# Patient Record
Sex: Female | Born: 1980
Health system: Southern US, Community
[De-identification: ages and names within clinical notes are randomized; demographics above are authoritative.]

## PROBLEM LIST (undated history)

## (undated) DIAGNOSIS — F329 Major depressive disorder, single episode, unspecified: Secondary | ICD-10-CM

## (undated) DIAGNOSIS — F32A Depression, unspecified: Secondary | ICD-10-CM

## (undated) DIAGNOSIS — R519 Headache, unspecified: Secondary | ICD-10-CM

## (undated) DIAGNOSIS — T7840XA Allergy, unspecified, initial encounter: Secondary | ICD-10-CM

## (undated) DIAGNOSIS — E785 Hyperlipidemia, unspecified: Secondary | ICD-10-CM

## (undated) DIAGNOSIS — R51 Headache: Secondary | ICD-10-CM

## (undated) DIAGNOSIS — F419 Anxiety disorder, unspecified: Secondary | ICD-10-CM

## (undated) HISTORY — DX: Headache: R51

## (undated) HISTORY — DX: Major depressive disorder, single episode, unspecified: F32.9

## (undated) HISTORY — PX: APPENDECTOMY: SHX54

## (undated) HISTORY — DX: Anxiety disorder, unspecified: F41.9

## (undated) HISTORY — DX: Hyperlipidemia, unspecified: E78.5

## (undated) HISTORY — DX: Depression, unspecified: F32.A

## (undated) HISTORY — DX: Allergy, unspecified, initial encounter: T78.40XA

## (undated) HISTORY — DX: Headache, unspecified: R51.9

---

## 2005-04-03 ENCOUNTER — Other Ambulatory Visit: Admission: RE | Admit: 2005-04-03 | Discharge: 2005-04-03 | Payer: Self-pay | Admitting: Family Medicine

## 2005-04-03 ENCOUNTER — Ambulatory Visit: Payer: Self-pay | Admitting: Family Medicine

## 2005-08-17 ENCOUNTER — Ambulatory Visit: Payer: Self-pay | Admitting: Family Medicine

## 2005-08-31 ENCOUNTER — Ambulatory Visit: Payer: Self-pay | Admitting: Internal Medicine

## 2005-09-07 ENCOUNTER — Ambulatory Visit: Payer: Self-pay | Admitting: Internal Medicine

## 2005-10-23 ENCOUNTER — Ambulatory Visit: Payer: Self-pay | Admitting: Internal Medicine

## 2006-02-04 ENCOUNTER — Ambulatory Visit: Payer: Self-pay | Admitting: Family Medicine

## 2006-02-05 ENCOUNTER — Ambulatory Visit: Payer: Self-pay | Admitting: Family Medicine

## 2006-05-03 ENCOUNTER — Encounter: Payer: Self-pay | Admitting: Family Medicine

## 2006-05-03 ENCOUNTER — Ambulatory Visit: Payer: Self-pay | Admitting: Family Medicine

## 2006-05-03 ENCOUNTER — Other Ambulatory Visit: Admission: RE | Admit: 2006-05-03 | Discharge: 2006-05-03 | Payer: Self-pay | Admitting: Family Medicine

## 2006-07-04 ENCOUNTER — Ambulatory Visit: Payer: Self-pay | Admitting: Family Medicine

## 2006-12-27 ENCOUNTER — Ambulatory Visit: Payer: Self-pay | Admitting: Family Medicine

## 2007-01-14 ENCOUNTER — Ambulatory Visit: Payer: Self-pay | Admitting: Internal Medicine

## 2007-05-07 DIAGNOSIS — F418 Other specified anxiety disorders: Secondary | ICD-10-CM | POA: Insufficient documentation

## 2007-05-14 ENCOUNTER — Other Ambulatory Visit: Admission: RE | Admit: 2007-05-14 | Discharge: 2007-05-14 | Payer: Self-pay | Admitting: Family Medicine

## 2007-05-14 ENCOUNTER — Ambulatory Visit: Payer: Self-pay | Admitting: Family Medicine

## 2007-05-14 ENCOUNTER — Encounter: Payer: Self-pay | Admitting: Family Medicine

## 2007-05-14 DIAGNOSIS — J309 Allergic rhinitis, unspecified: Secondary | ICD-10-CM | POA: Insufficient documentation

## 2007-05-14 LAB — CONVERTED CEMR LAB: Pap Smear: NORMAL

## 2007-05-27 ENCOUNTER — Telehealth: Payer: Self-pay | Admitting: Family Medicine

## 2007-06-05 ENCOUNTER — Telehealth (INDEPENDENT_AMBULATORY_CARE_PROVIDER_SITE_OTHER): Payer: Self-pay | Admitting: *Deleted

## 2007-06-05 DIAGNOSIS — F409 Phobic anxiety disorder, unspecified: Secondary | ICD-10-CM | POA: Insufficient documentation

## 2007-06-05 DIAGNOSIS — F5105 Insomnia due to other mental disorder: Secondary | ICD-10-CM

## 2007-06-10 ENCOUNTER — Encounter: Payer: Self-pay | Admitting: Family Medicine

## 2007-08-01 ENCOUNTER — Telehealth (INDEPENDENT_AMBULATORY_CARE_PROVIDER_SITE_OTHER): Payer: Self-pay | Admitting: *Deleted

## 2007-10-15 ENCOUNTER — Telehealth (INDEPENDENT_AMBULATORY_CARE_PROVIDER_SITE_OTHER): Payer: Self-pay | Admitting: *Deleted

## 2007-12-02 ENCOUNTER — Telehealth (INDEPENDENT_AMBULATORY_CARE_PROVIDER_SITE_OTHER): Payer: Self-pay | Admitting: *Deleted

## 2008-01-08 ENCOUNTER — Telehealth (INDEPENDENT_AMBULATORY_CARE_PROVIDER_SITE_OTHER): Payer: Self-pay | Admitting: *Deleted

## 2008-01-30 ENCOUNTER — Telehealth (INDEPENDENT_AMBULATORY_CARE_PROVIDER_SITE_OTHER): Payer: Self-pay | Admitting: *Deleted

## 2008-03-22 ENCOUNTER — Telehealth (INDEPENDENT_AMBULATORY_CARE_PROVIDER_SITE_OTHER): Payer: Self-pay | Admitting: *Deleted

## 2008-04-05 ENCOUNTER — Ambulatory Visit: Payer: Self-pay | Admitting: Family Medicine

## 2008-06-22 ENCOUNTER — Telehealth (INDEPENDENT_AMBULATORY_CARE_PROVIDER_SITE_OTHER): Payer: Self-pay | Admitting: *Deleted

## 2008-07-19 ENCOUNTER — Telehealth (INDEPENDENT_AMBULATORY_CARE_PROVIDER_SITE_OTHER): Payer: Self-pay | Admitting: *Deleted

## 2008-08-16 ENCOUNTER — Ambulatory Visit: Payer: Self-pay | Admitting: Family Medicine

## 2008-08-16 ENCOUNTER — Encounter: Payer: Self-pay | Admitting: Family Medicine

## 2008-08-16 ENCOUNTER — Other Ambulatory Visit: Admission: RE | Admit: 2008-08-16 | Discharge: 2008-08-16 | Payer: Self-pay | Admitting: Family Medicine

## 2008-08-16 DIAGNOSIS — F411 Generalized anxiety disorder: Secondary | ICD-10-CM | POA: Insufficient documentation

## 2008-08-18 ENCOUNTER — Telehealth (INDEPENDENT_AMBULATORY_CARE_PROVIDER_SITE_OTHER): Payer: Self-pay | Admitting: *Deleted

## 2008-08-20 ENCOUNTER — Encounter (INDEPENDENT_AMBULATORY_CARE_PROVIDER_SITE_OTHER): Payer: Self-pay | Admitting: *Deleted

## 2008-08-26 ENCOUNTER — Encounter (INDEPENDENT_AMBULATORY_CARE_PROVIDER_SITE_OTHER): Payer: Self-pay | Admitting: *Deleted

## 2008-08-26 LAB — CONVERTED CEMR LAB
AST: 19 units/L (ref 0–37)
Albumin: 3.7 g/dL (ref 3.5–5.2)
Alkaline Phosphatase: 43 units/L (ref 39–117)
Basophils Absolute: 0 10*3/uL (ref 0.0–0.1)
Chloride: 111 meq/L (ref 96–112)
Cholesterol: 164 mg/dL (ref 0–200)
Creatinine, Ser: 0.7 mg/dL (ref 0.4–1.2)
Eosinophils Absolute: 0.2 10*3/uL (ref 0.0–0.7)
GFR calc Af Amer: 129 mL/min
GFR calc non Af Amer: 107 mL/min
HCT: 40.6 % (ref 36.0–46.0)
HDL: 40 mg/dL (ref >39.0)
MCHC: 34.8 g/dL (ref 30.0–36.0)
MCV: 91.3 fL (ref 78.0–100.0)
Monocytes Absolute: 0.7 10*3/uL (ref 0.1–1.0)
Neutrophils Relative %: 59.9 % (ref 43.0–77.0)
Platelets: 346 10*3/uL (ref 150–400)
Potassium: 4 meq/L (ref 3.5–5.1)
RDW: 11.9 % (ref 11.5–14.6)
TSH: 2.08 microintl units/mL (ref 0.35–5.50)
Total Bilirubin: 1.3 mg/dL — ABNORMAL HIGH (ref 0.3–1.2)
Triglycerides: 103 mg/dL (ref 0–149)
VLDL: 21 mg/dL (ref 0–40)

## 2008-08-28 ENCOUNTER — Ambulatory Visit: Payer: Self-pay | Admitting: Family Medicine

## 2008-09-17 ENCOUNTER — Telehealth (INDEPENDENT_AMBULATORY_CARE_PROVIDER_SITE_OTHER): Payer: Self-pay | Admitting: *Deleted

## 2008-11-04 ENCOUNTER — Telehealth (INDEPENDENT_AMBULATORY_CARE_PROVIDER_SITE_OTHER): Payer: Self-pay | Admitting: *Deleted

## 2008-11-16 ENCOUNTER — Telehealth (INDEPENDENT_AMBULATORY_CARE_PROVIDER_SITE_OTHER): Payer: Self-pay | Admitting: *Deleted

## 2009-01-11 ENCOUNTER — Ambulatory Visit: Payer: Self-pay | Admitting: Family Medicine

## 2009-01-12 ENCOUNTER — Telehealth: Payer: Self-pay | Admitting: Family Medicine

## 2009-02-17 ENCOUNTER — Ambulatory Visit: Payer: Self-pay | Admitting: Family Medicine

## 2009-03-30 ENCOUNTER — Telehealth (INDEPENDENT_AMBULATORY_CARE_PROVIDER_SITE_OTHER): Payer: Self-pay | Admitting: *Deleted

## 2009-04-11 ENCOUNTER — Telehealth (INDEPENDENT_AMBULATORY_CARE_PROVIDER_SITE_OTHER): Payer: Self-pay | Admitting: *Deleted

## 2009-05-06 ENCOUNTER — Telehealth (INDEPENDENT_AMBULATORY_CARE_PROVIDER_SITE_OTHER): Payer: Self-pay | Admitting: *Deleted

## 2009-05-10 ENCOUNTER — Ambulatory Visit: Payer: Self-pay | Admitting: Family Medicine

## 2009-05-10 LAB — CONVERTED CEMR LAB
Beta hcg, urine, semiquantitative: NEGATIVE
Bilirubin Urine: NEGATIVE
Blood in Urine, dipstick: NEGATIVE
Ketones, urine, test strip: NEGATIVE
Nitrite: NEGATIVE
Specific Gravity, Urine: 1.015

## 2009-05-11 ENCOUNTER — Telehealth (INDEPENDENT_AMBULATORY_CARE_PROVIDER_SITE_OTHER): Payer: Self-pay | Admitting: *Deleted

## 2009-05-17 ENCOUNTER — Telehealth: Payer: Self-pay | Admitting: Family Medicine

## 2009-05-25 ENCOUNTER — Telehealth (INDEPENDENT_AMBULATORY_CARE_PROVIDER_SITE_OTHER): Payer: Self-pay | Admitting: *Deleted

## 2009-07-04 ENCOUNTER — Telehealth (INDEPENDENT_AMBULATORY_CARE_PROVIDER_SITE_OTHER): Payer: Self-pay | Admitting: *Deleted

## 2009-08-17 ENCOUNTER — Encounter: Payer: Self-pay | Admitting: Internal Medicine

## 2009-08-17 ENCOUNTER — Other Ambulatory Visit: Admission: RE | Admit: 2009-08-17 | Discharge: 2009-08-17 | Payer: Self-pay | Admitting: Internal Medicine

## 2009-08-17 ENCOUNTER — Ambulatory Visit: Payer: Self-pay | Admitting: Internal Medicine

## 2009-08-17 DIAGNOSIS — R5383 Other fatigue: Secondary | ICD-10-CM

## 2009-08-17 DIAGNOSIS — R5381 Other malaise: Secondary | ICD-10-CM

## 2009-08-17 DIAGNOSIS — E785 Hyperlipidemia, unspecified: Secondary | ICD-10-CM

## 2009-08-17 LAB — CONVERTED CEMR LAB
ALT: 13 units/L (ref 0–35)
AST: 17 units/L (ref 0–37)
Basophils Relative: 0 % (ref 0.0–3.0)
Bilirubin Urine: NEGATIVE
Chloride: 106 meq/L (ref 96–112)
Cholesterol: 205 mg/dL — ABNORMAL HIGH (ref 0–200)
Direct LDL: 138 mg/dL
Eosinophils Relative: 0.9 % (ref 0.0–5.0)
GFR calc non Af Amer: 90.42 mL/min (ref >60)
HCT: 41.7 % (ref 36.0–46.0)
Hemoglobin, Urine: NEGATIVE
Hemoglobin: 14.5 g/dL (ref 12.0–15.0)
Iron: 165 ug/dL — ABNORMAL HIGH (ref 42–145)
Ketones, ur: NEGATIVE mg/dL
Leukocytes, UA: NEGATIVE
Lymphs Abs: 2 10*3/uL (ref 0.7–4.0)
MCV: 91.4 fL (ref 78.0–100.0)
Monocytes Absolute: 0.5 10*3/uL (ref 0.1–1.0)
Monocytes Relative: 6.3 % (ref 3.0–12.0)
Neutro Abs: 5.5 10*3/uL (ref 1.4–7.7)
Platelets: 299 10*3/uL (ref 150.0–400.0)
Potassium: 4.3 meq/L (ref 3.5–5.1)
RBC: 4.57 M/uL (ref 3.87–5.11)
Saturation Ratios: 36.7 % (ref 20.0–50.0)
Sodium: 138 meq/L (ref 135–145)
TSH: 0.94 microintl units/mL (ref 0.35–5.50)
Total Bilirubin: 1.3 mg/dL — ABNORMAL HIGH (ref 0.3–1.2)
Total CHOL/HDL Ratio: 4
Total Protein: 7.2 g/dL (ref 6.0–8.3)
Transferrin: 321.1 mg/dL (ref 212.0–360.0)
Triglycerides: 117 mg/dL (ref 0.0–149.0)
Vit D, 25-Hydroxy: 28 ng/mL — ABNORMAL LOW (ref 30–89)
WBC: 8.1 10*3/uL (ref 4.5–10.5)
pH: 5.5 (ref 5.0–8.0)

## 2009-08-18 ENCOUNTER — Encounter: Payer: Self-pay | Admitting: Internal Medicine

## 2009-08-19 ENCOUNTER — Encounter: Payer: Self-pay | Admitting: Internal Medicine

## 2009-08-19 ENCOUNTER — Telehealth: Payer: Self-pay | Admitting: Internal Medicine

## 2009-09-13 ENCOUNTER — Telehealth: Payer: Self-pay | Admitting: Internal Medicine

## 2009-09-14 ENCOUNTER — Telehealth: Payer: Self-pay | Admitting: Internal Medicine

## 2009-10-07 ENCOUNTER — Telehealth: Payer: Self-pay | Admitting: Internal Medicine

## 2009-10-11 ENCOUNTER — Telehealth: Payer: Self-pay | Admitting: Internal Medicine

## 2009-10-21 ENCOUNTER — Telehealth: Payer: Self-pay | Admitting: Internal Medicine

## 2009-10-24 ENCOUNTER — Telehealth: Payer: Self-pay | Admitting: Internal Medicine

## 2009-11-01 ENCOUNTER — Telehealth (INDEPENDENT_AMBULATORY_CARE_PROVIDER_SITE_OTHER): Payer: Self-pay | Admitting: *Deleted

## 2009-11-02 ENCOUNTER — Ambulatory Visit: Payer: Self-pay | Admitting: Internal Medicine

## 2009-11-02 DIAGNOSIS — M26609 Unspecified temporomandibular joint disorder, unspecified side: Secondary | ICD-10-CM | POA: Insufficient documentation

## 2010-01-09 ENCOUNTER — Telehealth: Payer: Self-pay | Admitting: Internal Medicine

## 2010-01-13 ENCOUNTER — Telehealth: Payer: Self-pay | Admitting: Internal Medicine

## 2010-01-18 ENCOUNTER — Ambulatory Visit: Payer: Self-pay | Admitting: Internal Medicine

## 2010-03-16 ENCOUNTER — Telehealth: Payer: Self-pay | Admitting: Internal Medicine

## 2010-03-21 ENCOUNTER — Telehealth: Payer: Self-pay | Admitting: Internal Medicine

## 2010-04-17 ENCOUNTER — Encounter: Payer: Self-pay | Admitting: Internal Medicine

## 2010-07-06 ENCOUNTER — Telehealth: Payer: Self-pay | Admitting: Internal Medicine

## 2010-07-13 ENCOUNTER — Telehealth: Payer: Self-pay | Admitting: Internal Medicine

## 2010-08-18 ENCOUNTER — Other Ambulatory Visit: Admission: RE | Admit: 2010-08-18 | Discharge: 2010-08-18 | Payer: Self-pay | Admitting: Internal Medicine

## 2010-08-18 ENCOUNTER — Ambulatory Visit: Payer: Self-pay | Admitting: Internal Medicine

## 2010-08-18 LAB — CONVERTED CEMR LAB
Basophils Absolute: 0 10*3/uL (ref 0.0–0.1)
Eosinophils Absolute: 0.1 10*3/uL (ref 0.0–0.7)
HCT: 39.7 % (ref 36.0–46.0)
HDL: 61.1 mg/dL (ref >39.00)
Lymphs Abs: 2.2 10*3/uL (ref 0.7–4.0)
MCV: 89.8 fL (ref 78.0–100.0)
Monocytes Absolute: 0.7 10*3/uL (ref 0.1–1.0)
Platelets: 290 10*3/uL (ref 150.0–400.0)
RDW: 12.6 % (ref 11.5–14.6)
TSH: 1.56 microintl units/mL (ref 0.35–5.50)
Triglycerides: 109 mg/dL (ref 0.0–149.0)
VLDL: 21.8 mg/dL (ref 0.0–40.0)

## 2010-08-25 ENCOUNTER — Encounter: Payer: Self-pay | Admitting: Internal Medicine

## 2010-09-07 ENCOUNTER — Encounter: Payer: Self-pay | Admitting: Internal Medicine

## 2010-09-11 ENCOUNTER — Telehealth: Payer: Self-pay | Admitting: Internal Medicine

## 2010-10-09 ENCOUNTER — Telehealth: Payer: Self-pay | Admitting: Internal Medicine

## 2010-10-30 ENCOUNTER — Telehealth (INDEPENDENT_AMBULATORY_CARE_PROVIDER_SITE_OTHER): Payer: Self-pay | Admitting: *Deleted

## 2010-12-05 ENCOUNTER — Telehealth: Payer: Self-pay | Admitting: Internal Medicine

## 2011-01-16 NOTE — Progress Notes (Signed)
Summary: REFERRAL  Phone Note Call from Patient Call back at Home Phone [phone]   Summary of Call: Pt found an "in network" oral surgeon and we referred her to someone out of network. She is req a call to give [place] the name.  Initial call taken by: Lamar Sprinkles, CMA,  January 13, 2010 4:06 PM  Follow-up for Phone Call        Please put in new referral to Dr Alonna Buckler at [facility] [employer] [employer]. She has phone # and will call office back monday with that if needed.  Follow-up by: Lamar Sprinkles, CMA,  January 13, 2010 6:18 PM

## 2011-01-16 NOTE — Miscellaneous (Signed)
Summary: Advanced Directive and Durable Power of Attorney for Health Care  Advanced Directive and Durable Power of Attorney for Health Care   Imported By: Lester Kenwood 09/22/2010 08:18:03  _____________________________________________________________________  External Attachment:    Type:   Image     Comment:   External Document

## 2011-01-16 NOTE — Progress Notes (Signed)
Summary: ALT BCP  Phone Note Call from Patient Call back at [facility] Phone [phone]   Summary of Call: Patient is requesting to change from Jolessa to Yaz to help with acne. If ok, how does she change from one to the other? She just started her first pill, yesterday, of a new pack of Jolessa.  Initial call taken by: Lamar Sprinkles, CMA,  September 11, 2010 3:44 PM  Follow-up for Phone Call        change to yaz on the next pak Follow-up by: Etta Grandchild MD,  September 11, 2010 3:53 PM  Additional Follow-up for Phone Call Additional follow up Details #1::        Left detailed vm on pt's home # Additional Follow-up by: Lamar Sprinkles, CMA,  September 11, 2010 5:04 PM    New/Updated Medications: YAZ 3-0.02 MG TABS (DROSPIRENONE-ETHINYL ESTRADIOL) as directed Prescriptions: YAZ 3-0.02 MG TABS (DROSPIRENONE-ETHINYL ESTRADIOL) as directed  #3 x 1   Entered by:   Lamar Sprinkles, CMA   Authorized by:   Etta Grandchild MD   Signed by:   Lamar Sprinkles, CMA on 09/11/2010   Method used:   Electronically to        [facility] (retail)       [address]       [place], [place]  [address]       Ph: [phone]       Fax: [phone]   RxID:   [id number]

## 2011-01-16 NOTE — Letter (Signed)
Summary: Results Follow-up Letter  [facility]  [address] [place], [place] [address]   Phone: [phone]  Fax: [phone]    08/25/2010  [address] [place], [place]  [address]  Dear Ms. [facility],   The following are the results of your recent test(s):  Test     Result     Pap Smear    Normal___XX____  Not Normal_____       Comments: _________________________________________________________ Cholesterol LDL(Bad cholesterol):          Your goal is less than:         HDL (Good cholesterol):        Your goal is more than: _________________________________________________________ Other Tests:   _________________________________________________________  Please call for an appointment Or _________________________________________________________ _________________________________________________________ _________________________________________________________  Sincerely,  Sanda Linger MD [facility]           Appended Document: Results Follow-up Letter Mailed.

## 2011-01-16 NOTE — Progress Notes (Signed)
  Phone Note Refill Request Message from:  Fax from Pharmacy on March 16, 2010 10:46 AM  Refills Requested: Medication #1:  ALPRAZOLAM 0.25 MG TABS 1 by mouth three times a day   Supply Requested: 1 month   Last Refilled: 08/17/2009  Is this OK to refill for pt. Please advise thanks  [facility]  Initial call taken by: Rock Nephew CMA,  March 16, 2010 11:19 AM  Follow-up for Phone Call        yes Follow-up by: Etta Grandchild MD,  March 16, 2010 10:52 AM     Appended Document:      Clinical Lists Changes  Medications: Rx of ALPRAZOLAM 0.25 MG TABS (ALPRAZOLAM) 1 by mouth three times a day;  #90 x 4;  Signed;  Entered by: Rock Nephew CMA;  Authorized by: Etta Grandchild MD;  Method used: Telephoned to [facility] [address] [place], [place], [place]  [address], Ph: [phone], Fax: [phone]    Prescriptions: ALPRAZOLAM 0.25 MG TABS (ALPRAZOLAM) 1 by mouth three times a day  #90 x 4   Entered by:   Rock Nephew CMA   Authorized by:   Etta Grandchild MD   Signed by:   Rock Nephew CMA on 03/20/2010   Method used:   Telephoned to ...       [facility] (retail)       [address]       [place], [place]  [address]       Ph: [phone]       Fax: [phone]   RxID:   [id number]

## 2011-01-16 NOTE — Progress Notes (Signed)
Summary: REFILL   Phone Note Refill Request Call back at Home Phone [phone] Message from:  Patient  Refills Requested: Medication #1:  KLONOPIN 1 MG TABS Take 1 tablet by mouth three times a day Initial call taken by: Lamar Sprinkles, CMA,  March 21, 2010 4:54 PM  Follow-up for Phone Call        yes Follow-up by: Etta Grandchild MD,  March 22, 2010 7:57 AM  Additional Follow-up for Phone Call Additional follow up Details #1::        patient aware. Additional Follow-up by: Lucious Groves,  March 23, 2010 4:53 PM    Prescriptions: KLONOPIN 1 MG TABS (CLONAZEPAM) Take 1 tablet by mouth three times a day  #90 x 3   Entered by:   Lucious Groves   Authorized by:   Etta Grandchild MD   Signed by:   Lucious Groves on 03/23/2010   Method used:   Telephoned to ...       [facility] (retail)       [address]       [place], [place]  [address]       Ph: [phone]       Fax: [phone]   RxID:   [id number]

## 2011-01-16 NOTE — Progress Notes (Signed)
Summary: clonazepam refill  Phone Note Refill Request Message from:  Fax from Pharmacy on July 13, 2010 2:53 PM  Refills Requested: Medication #1:  KLONOPIN 1 MG TABS Take 1 tablet by mouth three times a day   Dosage confirmed as above?Dosage Confirmed   Supply Requested: 1 month   Last Refilled: 03/23/2010   [facility]?   Method Requested: Telephone to Pharmacy Initial call taken by: Rock Nephew CMA,  July 13, 2010 2:53 PM  Follow-up for Phone Call        ok, pls ask her to f/up soon Follow-up by: Etta Grandchild MD,  July 13, 2010 3:29 PM    Prescriptions: Erin Mays 1 MG TABS (CLONAZEPAM) Take 1 tablet by mouth three times a day  #90 x 3   Entered by:   Lamar Sprinkles, CMA   Authorized by:   Etta Grandchild MD   Signed by:   Lamar Sprinkles, CMA on 07/13/2010   Method used:   Telephoned to ...       [facility] (retail)       [address]       [place], [place]  [address]       Ph: [phone]       Fax: [phone]   RxID:   [id number]

## 2011-01-16 NOTE — Letter (Signed)
Summary: Lipid Letter  [facility]  [address] [place], [place] [address]   Phone: [phone]  Fax: [phone]    08/18/2010  [facility] [address] [place], [place]  [address]  Dear Erin Mays:  We have carefully reviewed your last lipid profile from 08/18/2010 and the results are noted below with a summary of recommendations for lipid management.    Cholesterol:       194     Goal: <200   HDL "good" Cholesterol:   57.84     Goal: >40   LDL "bad" Cholesterol:   111     Goal: <130   Triglycerides:       109.0     Goal: <150        TLC Diet (Therapeutic Lifestyle Change): Saturated Fats & Transfatty acids should be kept < 7% of total calories ***Reduce Saturated Fats Polyunstaurated Fat can be up to 10% of total calories Monounsaturated Fat Fat can be up to 20% of total calories Total Fat should be no greater than 25-35% of total calories Carbohydrates should be 50-60% of total calories Protein should be approximately 15% of total calories Fiber should be at least 20-30 grams a day ***Increased fiber may help lower LDL Total Cholesterol should be < 200mg /day Consider adding plant stanol/sterols to diet (example: Benacol spread) ***A higher intake of unsaturated fat may reduce Triglycerides and Increase HDL    Adjunctive Measures (may lower LIPIDS and reduce risk of Heart Attack) include: Aerobic Exercise (20-30 minutes 3-4 times a week) Limit Alcohol Consumption Weight Reduction Aspirin 75-81 mg a day by mouth (if not allergic or contraindicated) Dietary Fiber 20-30 grams a day by mouth     Current Medications: 1)    Jolessa 0.15-0.03 Mg Tabs (Levonorgest-eth estrad 91-day) .... As directed 2)    Multivitamin  .... One by mouth once daily 3)    Klonopin 1 Mg Tabs (Clonazepam) .... Take 1 tablet by mouth three times a day 4)    Bupropion Hcl 75 Mg Tabs (Bupropion hcl) .... One by mouth bid 5)    Nasonex 50 Mcg/act Susp (Mometasone furoate)  .... 2 puffs each nostril once daily  If you have any questions, please call. We appreciate being able to work with you.   Sincerely,    [facility] Etta Grandchild MD

## 2011-01-16 NOTE — Consult Note (Signed)
Summary: [facility]   Imported By: Sherian Rein 04/27/2010 08:05:32  _____________________________________________________________________  External Attachment:    Type:   Image     Comment:   External Document

## 2011-01-16 NOTE — Assessment & Plan Note (Signed)
Summary: CPX /  LABS AFTER /NWS   #   Vital Signs:  Patient profile:   30 year old female Menstrual status:  regular LMP:     08/13/2010 Height:      66 inches (167.64 cm) Weight:      139 pounds (63.18 kg) BMI:     22.52 O2 Sat:      98 % on Room air Temp:     98.6 degrees F (37.00 degrees C) oral Pulse rate:   84 / minute Pulse rhythm:   regular Resp:     16 per minute BP sitting:   104 / 66  (left arm) Cuff size:   regular  Vitals Entered By: Lucious Groves CMA (August 18, 2010 8:56 AM)  O2 Flow:  Room air CC: CPX/Discuss Meds./kb, Preventive Care Is Patient Diabetic? No Pain Assessment Patient in pain? no      LMP (date): 08/13/2010     Enter LMP: 08/13/2010 Last PAP Result NEGATIVE FOR INTRAEPITHELIAL LESIONS OR MALIGNANCY.   Primary Care Provider:  Yetta Barre  CC:  CPX/Discuss Meds./kb and Preventive Care.  History of Present Illness: She returns for a complete physical but also complains of seasonal allergies and wants to try Nasonex NS again.  Preventive Screening-Counseling & Management  Alcohol-Tobacco     Alcohol drinks/day: 1     Alcohol type: wine     Smoking Status: never     Passive Smoke Exposure: no     Tobacco Counseling: not indicated; no tobacco use  Hep-HIV-STD-Contraception     Hepatitis Risk: no risk noted     HIV Risk: no     STD Risk: no risk noted     Dental Visit-last 6 months yes     Dental Care Counseling: to seek dental care; no dental care within six months     SBE monthly: yes     SBE Education/Counseling: to perform regular SBE     Sun Exposure-Excessive: occasionally     Sun Exposure Counseling: to decrease sun exposure  Safety-Violence-Falls     Seat Belt Use: yes     Helmet Use: yes     Firearms in the Home: no firearms in the home     Smoke Detectors: yes     Violence in the Home: no risk noted     Sexual Abuse: no      Sexual History:  currently monogamous.        Drug Use:  never.        Blood Transfusions:   no.    Clinical Review Panels:  Immunizations   Last Tetanus Booster:  Tdap (08/16/2008)   Medications Prior to Update: 1)  Celexa 20 Mg Tabs (Citalopram Hydrobromide) .... Take 1 Tablet By Mouth Once A Day 2)  Jolessa 0.15-0.03 Mg Tabs (Levonorgest-Eth Estrad 91-Day) .... As Directed 3)  Prenatal Multivitamin .... One By Mouth Once Daily / [facility] Brand Ok 4)  Alprazolam 0.25 Mg Tabs (Alprazolam) .Marland Kitchen.. 1 By Mouth Three Times A Day 5)  Klonopin 1 Mg Tabs (Clonazepam) .... Take 1 Tablet By Mouth Three Times A Day 6)  Valtrex 1 Gm Tabs (Valacyclovir Hcl) .... Take 1 By Mouth Once Daily 7)  Bupropion Hcl 75 Mg Tabs (Bupropion Hcl) .... One By Mouth Bid 8)  Amrix 15 Mg Xr24h-Cap (Cyclobenzaprine Hcl) .... Once Daily At Bedtime  Current Medications (verified): 1)  Jolessa 0.15-0.03 Mg Tabs (Levonorgest-Eth Estrad 91-Day) .... As Directed 2)  Multivitamin .... One By Mouth  Once Daily 3)  Klonopin 1 Mg Tabs (Clonazepam) .... Take 1 Tablet By Mouth Three Times A Day 4)  Bupropion Hcl 75 Mg Tabs (Bupropion Hcl) .... One By Mouth Bid 5)  Nasonex 50 Mcg/act Susp (Mometasone Furoate) .... 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 Family History Depression Family History High cholesterol Family History Hypertension MGF- diabetes MGaunt- Breast CA  Social History: Reviewed history from 08/17/2009 and no changes required. 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 ENT:  Complains of nasal congestion and postnasal drainage; denies decreased hearing, difficulty swallowing, ear discharge, earache, hoarseness, nosebleeds, ringing in ears, sinus pressure, and sore throat. GU:  Denies abnormal vaginal bleeding, discharge, dysuria, genital sores, hematuria, and nocturia. Psych:  Complains of anxiety; denies alternate hallucination ( auditory/visual), depression, easily angered, easily tearful, irritability, mental problems, panic attacks, sense of great danger, suicidal thoughts/plans, thoughts of violence, unusual visions or sounds, and thoughts /plans of harming others.  Physical Exam  General:  alert, well-developed, well-nourished, well-hydrated, appropriate dress, normal appearance, healthy-appearing, cooperative to examination, and good hygiene.   Head:  normocephalic, atraumatic, no abnormalities observed, no abnormalities palpated, and no alopecia.   Eyes:  vision grossly intact, pupils equal, pupils round, and pupils reactive to light.   Ears:  R ear normal and L ear normal.   Nose:  no external deformity, no external erythema, no nasal discharge, no airflow obstruction, no intranasal foreign body, no nasal polyps, no nasal  mucosal lesions, no mucosal friability, no active bleeding or clots, no sinus percussion tenderness, no septum abnormalities, nasal dischargemucosal pallor, and mucosal edema.   Mouth:  Oral mucosa and oropharynx without lesions or exudates.  Teeth in good repair. Neck:  supple, full ROM, no masses, no thyromegaly, no thyroid nodules or tenderness, normal carotid upstroke, no carotid bruits, no cervical lymphadenopathy, and no neck tenderness.   Chest Wall:  no deformities, no tenderness, and no mass.   Breasts:  left nipple pierced. skin/areolae normal, no masses, no abnormal thickening, no nipple discharge, and no tenderness.   Lungs:  normal respiratory effort, no intercostal retractions, no accessory muscle use, normal breath sounds, no dullness, no fremitus, no crackles, and no wheezes.   Heart:  normal rate, regular rhythm, no murmur, no gallop, no rub, and no JVD.   Abdomen:  soft, non-tender, normal bowel sounds, no distention, no masses, no guarding, no rigidity, no rebound tenderness, no abdominal hernia, no inguinal hernia, no  hepatomegaly, and no splenomegaly.   Genitalia:  Normal introitus for age, no external lesions, no vaginal discharge, mucosa pink and moist, no vaginal or cervical lesions, no vaginal atrophy, no friaility or hemorrhage, normal uterus size and position, no adnexal masses or tenderness Msk:  normal ROM, no joint tenderness, no joint swelling, no joint warmth, no redness over joints, no joint deformities, no joint instability, and no crepitation.   Pulses:  R and L carotid,radial,femoral,dorsalis pedis and posterior tibial pulses are full and equal bilaterally Extremities:  No clubbing, cyanosis, edema, or deformity noted with normal full range of motion of all joints.   Neurologic:  No cranial nerve deficits noted. Station and gait are normal. Plantar reflexes are down-going bilaterally. DTRs are symmetrical throughout. Sensory, motor and coordinative functions appear  intact. Skin:  turgor normal, no rashes, no suspicious lesions, no ecchymoses, no petechiae, no purpura, no ulcerations, no edema, body piercing(s), and excessive tan.   Cervical Nodes:  no anterior cervical adenopathy and no posterior cervical adenopathy.   Axillary Nodes:  no R axillary adenopathy and no L axillary adenopathy.   Inguinal Nodes:  no R inguinal adenopathy and no L inguinal adenopathy.   Psych:  Oriented X3, memory intact for recent and remote, normally interactive, good eye contact, not anxious appearing, not depressed appearing, not agitated, not suicidal, and not homicidal.     Impression & Recommendations:  Problem # 1:  ROUTINE GENERAL MEDICAL EXAM@HEALTH  CARE FACL (ICD-V70.0) Assessment Unchanged  Orders: T-GC Probe, genital [phone]) T-Chlamydia Probe, genital [phone])  Pap smear: NEGATIVE FOR INTRAEPITHELIAL LESIONS OR MALIGNANCY. (08/17/2009) Td Booster: Tdap (08/16/2008)   Chol: 205 (08/17/2009)   HDL: 49.50 (08/17/2009)   LDL: 103 (08/16/2008)   TG: 117.0 (08/17/2009) TSH: 0.94 (08/17/2009)    Discussed using sunscreen, use of alcohol, drug use, self breast exam, routine dental care, routine eye care, schedule for GYN exam, routine physical exam, seat belts, multiple vitamins, osteoporosis prevention, adequate calcium intake in diet, recommendations for immunizations, mammograms and Pap smears.  Discussed exercise and checking cholesterol.  Discussed gun safety, safe sex, and contraception.  Problem # 2:  FATIGUE, ACUTE (ICD-780.79) Assessment: Unchanged  Orders: Venipuncture (30865) TLB-Lipid Panel (80061-LIPID) TLB-CBC Platelet - w/Differential (85025-CBCD) TLB-TSH (Thyroid Stimulating Hormone) (84443-TSH)  Problem # 3:  HYPERLIPIDEMIA (ICD-272.4) Assessment: Unchanged  Orders: Venipuncture (78469) TLB-Lipid Panel (80061-LIPID) TLB-CBC Platelet - w/Differential (85025-CBCD) TLB-TSH (Thyroid Stimulating Hormone) (84443-TSH)  Labs  Reviewed: SGOT: 17 (08/17/2009)   SGPT: 13 (08/17/2009)   HDL:49.50 (08/17/2009), 40.0 (08/16/2008)  LDL:103 (08/16/2008)  Chol:205 (08/17/2009), 164 (08/16/2008)  Trig:117.0 (08/17/2009), 103 (08/16/2008)  Problem # 4:  ALLERGIC RHINITIS (ICD-477.9) Assessment: Deteriorated  Her updated medication list for this problem includes:    Nasonex 50 Mcg/act Susp (Mometasone furoate) .Marland Kitchen... 2 puffs each nostril once daily  Discussed use of allergy medications and environmental measures.   Complete Medication List: 1)  Jolessa 0.15-0.03 Mg Tabs (Levonorgest-eth estrad 91-day) .... As directed 2)  Multivitamin  .... One by mouth once daily 3)  Klonopin 1 Mg Tabs (Clonazepam) .... Take 1 tablet by mouth three times a day 4)  Bupropion Hcl 75 Mg Tabs (Bupropion hcl) .... One by mouth bid 5)  Nasonex 50 Mcg/act Susp (Mometasone furoate) .... 2 puffs each nostril once daily  PAP Screening:    Hx Cervical Dysplasia in last 5 yrs? No    3 normal PAP smears in last 5 yrs? Yes    Last PAP smear:  08/17/2009    Reviewed PAP  smear recommendations:  PAP smear done  Osteoporosis Risk Assessment:  Risk Factors for Fracture or Low Bone Density:   Race (White or Asian):     yes   Smoking status:       never  Immunization & Chemoprophylaxis:    Tetanus vaccine: Tdap  (08/16/2008)  Patient Instructions: 1)  Please schedule a follow-up appointment as needed. 2)  If you could be exposed to sexually transmitted diseases, you should use a condom. 3)  If you are having sex and you or your partner don't want a child, use contraception. Prescriptions: NASONEX 50 MCG/ACT SUSP (MOMETASONE FUROATE) 2 puffs each nostril once daily  #5 inhs x 0   Entered and Authorized by:   Etta Grandchild MD   Signed by:   Etta Grandchild MD on 08/18/2010   Method used:   Samples Given   RxID:   [id number]

## 2011-01-16 NOTE — Progress Notes (Signed)
----   Converted from flag ---- ---- 10/09/2010 4:07 PM, Rock Nephew CMA wrote: VALACYCLOVIR 1GM    TAB GREE   NDCNUM:          [id number]   Instructions:    TAKE ONE TABLET BY MOUTH ONE TIME DAILY   Quantity:        30 Tablet   Refills:         3      Date of Request: 10/08/2010 10:44:41    Is this ok to refill? ------------------------------     Follow-up for Phone Call       Follow-up by: Etta Grandchild MD,  October 09, 2010 4:11 PM    Additional Follow-up for Phone Call Additional follow up Details #2::    yes Follow-up by: Etta Grandchild MD,  October 09, 2010 4:11 PM

## 2011-01-16 NOTE — Progress Notes (Signed)
Summary: xanax refill  Phone Note Refill Request Message from:  Fax from Pharmacy on July 06, 2010 8:43 AM  Refills Requested: Medication #1:  ALPRAZOLAM 0.25 MG TABS 1 by mouth three times a day   Dosage confirmed as above?Dosage Confirmed   Supply Requested: 1 month   Last Refilled: 03/23/2010   Is this ok to refill for pt, please advise Thanks  [facility]   Method Requested: Telephone to Pharmacy Initial call taken by: Rock Nephew CMA,  July 06, 2010 8:43 AM  Follow-up for Phone Call        yes Follow-up by: Etta Grandchild MD,  July 06, 2010 8:52 AM    Prescriptions: ALPRAZOLAM 0.25 MG TABS (ALPRAZOLAM) 1 by mouth three times a day  #90 x 6   Entered by:   Rock Nephew CMA   Authorized by:   Etta Grandchild MD   Signed by:   Rock Nephew CMA on 07/06/2010   Method used:   Telephoned to ...       [facility] (retail)       [address]       [place], [place]  [address]       Ph: [phone]       Fax: [phone]   RxID:   [id number]

## 2011-01-16 NOTE — Progress Notes (Signed)
Summary: Medication   Phone Note Call from Patient Call back at Home Phone [phone]   Caller: Patient Summary of Call: (Per fax) Patient left messg on the call a nurse line requesting to speak with someone about adding/changing Wellbutrin. She was instructed to call back during office hours.  Follow-up for Phone Call        Returned call to patient//lmovm to call back.Alvy Beal Archie CMA  October 31, 2010 8:58 AM   Patient is requesting rx to help in addition to welbutrin. She feels angry too quickly and overall irritable. Follow-up by: Lamar Sprinkles, CMA,  October 31, 2010 10:29 AM  Additional Follow-up for Phone Call Additional follow up Details #1::        done Additional Follow-up by: Etta Grandchild MD,  October 31, 2010 10:31 AM    Additional Follow-up for Phone Call Additional follow up Details #2::    lmovm for pt to check pharmacy.Alvy Beal Archie CMA  October 31, 2010 11:22 AM   New/Updated Medications: SERTRALINE HCL 50 MG TABS (SERTRALINE HCL) One by mouth once daily Prescriptions: SERTRALINE HCL 50 MG TABS (SERTRALINE HCL) One by mouth once daily  #30 x 11   Entered and Authorized by:   Etta Grandchild MD   Signed by:   Etta Grandchild MD on 10/31/2010   Method used:   Electronically to        [facility] (retail)       [address]       [place], [place]  [address]       Ph: [phone]       Fax: [phone]   RxID:   [id number]

## 2011-01-16 NOTE — Progress Notes (Signed)
Summary: [facility]  Phone Note Call from Patient Call back at [facility] Phone [phone]   Summary of Call: Patient is requesting alternative form of welbutrin. She is currently on the 24hr XL and would like the non extended release. I informed pt that I did not believe she would be able to get a "non extended release" but it does come in an ER form. She feels that the XL causes stomach upset midday.  Initial call taken by: Lamar Sprinkles, CMA,  January 09, 2010 9:44 AM  Follow-up for Phone Call        done Follow-up by: Etta Grandchild MD,  January 09, 2010 9:51 AM  Additional Follow-up for Phone Call Additional follow up Details #1::        Left mess for pt to check with her pharm Additional Follow-up by: Lamar Sprinkles, CMA,  January 09, 2010 10:23 AM    New/Updated Medications: BUPROPION HCL 75 MG TABS (BUPROPION HCL) One by mouth BID Prescriptions: BUPROPION HCL 75 MG TABS (BUPROPION HCL) One by mouth BID  #60 x 11   Entered and Authorized by:   Etta Grandchild MD   Signed by:   Etta Grandchild MD on 01/09/2010   Method used:   Electronically to        [facility] (retail)       [address]       [place], [place]  [address]       Ph: [phone]       Fax: [phone]   RxID:   [id number]

## 2011-01-16 NOTE — Assessment & Plan Note (Signed)
Summary: DEPRESSION/NWS  #   Vital Signs:  Patient profile:   30 year old female Menstrual status:  regular Height:      66 inches Weight:      131 pounds BMI:     21.22 O2 Sat:      97 % on Room air Temp:     98.0 degrees F oral Pulse rate:   87 / minute Pulse rhythm:   regular Resp:     16 per minute BP sitting:   108 / 70  (left arm) Cuff size:   large  Vitals Entered By: Rock Nephew CMA (January 18, 2010 8:34 AM)  O2 Flow:  Room air  Primary Care Provider:  Yetta Barre   History of Present Illness: She returns for a f/up and she reports that she obtained a new job with her company that will reduce her stress level so her sense of well-being is markedly improved recently.  Depression History:      The patient comes in today for her second follow up visit for depression.  The patient denies a depressed mood most of the day and a diminished interest in her usual daily activities.  Positive alarm features for depression include insomnia.  However, she denies significant weight loss, significant weight gain, hypersomnia, psychomotor agitation, psychomotor retardation, fatigue (loss of energy), feelings of worthlessness (guilt), impaired concentration (indecisiveness), and recurrent thoughts of death or suicide.  The patient denies symptoms of a manic disorder including persistently & abnormally elevated mood, abnormally & persistently irritable mood, less need for sleep, distractibility, flight of ideas, and psychomotor agitation.        Psychosocial stress factors include major life changes.  The patient denies that she feels like life is not worth living, denies that she wishes that she were dead, and denies that she has thought about ending her life.         Preventive Screening-Counseling & Management  Alcohol-Tobacco     Alcohol drinks/day: 1     Alcohol type: wine     Smoking Status: never     Passive Smoke Exposure: no  Current Medications (verified): 1)  Celexa 20 Mg  Tabs (Citalopram Hydrobromide) .... Take 1 Tablet By Mouth Once A Day 2)  Jolessa 0.15-0.03 Mg Tabs (Levonorgest-Eth Estrad 91-Day) .... As Directed 3)  Prenatal Multivitamin .... One By Mouth Once Daily / [facility] Brand Ok 4)  Alprazolam 0.25 Mg Tabs (Alprazolam) .Marland Kitchen.. 1 By Mouth Three Times A Day 5)  Klonopin 1 Mg Tabs (Clonazepam) .... Take 1 Tablet By Mouth Three Times A Day 6)  Valtrex 1 Gm Tabs (Valacyclovir Hcl) .... Take 1 By Mouth Once Daily 7)  Bupropion Hcl 75 Mg Tabs (Bupropion Hcl) .... One By Mouth Bid 8)  Amrix 15 Mg Xr24h-Cap (Cyclobenzaprine Hcl) .... Once Daily At Bedtime  Allergies (verified): No Known Drug Allergies  Past History:  Past Medical History: Reviewed history from 08/17/2009 and no changes required. Depression Allergic rhinitis Anxiety Hyperlipidemia  Past Surgical History: Reviewed history from 08/17/2009 and no changes required. Appendectomy  Family History: Reviewed history from 05/14/2007 and no changes required. Family History Depression Family History High cholesterol Family History Hypertension MGF- diabetes MGaunt- Breast CA  Social History: Reviewed history from 08/17/2009 and no changes required. 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    Physical Exam  General:  alert, well-developed, well-nourished, well-hydrated, appropriate dress, normal appearance, healthy-appearing, and cooperative to examination.   Mouth:  TMJ has good ROM bilaterally with no crepitance or ttp. She has an underbite. Neck:  supple, full ROM, no masses, no thyromegaly, no carotid bruits, and no cervical lymphadenopathy.   Lungs:  normal respiratory effort, no intercostal retractions, no accessory muscle use, and normal breath sounds.   Heart:  normal rate, regular rhythm, no  murmur, and no gallop.   Abdomen:  soft, non-tender, normal bowel sounds, and no distention.   Skin:  Intact without suspicious lesions or rashes Cervical Nodes:  no anterior cervical adenopathy and no posterior cervical adenopathy.   Psych:  Cognition and judgment appear intact. Alert and cooperative with normal attention span and concentration. No apparent delusions, illusions, hallucinationsgood eye contact, not anxious appearing, not depressed appearing, not agitated, not suicidal, and not homicidal.     Impression & Recommendations:  Problem # 1:  DEPRESSION (ICD-311) Assessment Improved  Her updated medication list for this problem includes:    Celexa 20 Mg Tabs (Citalopram hydrobromide) .Marland Kitchen... Take 1 tablet by mouth once a day    Alprazolam 0.25 Mg Tabs (Alprazolam) .Marland Kitchen... 1 by mouth three times a day    Klonopin 1 Mg Tabs (Clonazepam) .Marland Kitchen... Take 1 tablet by mouth three times a day    Bupropion Hcl 75 Mg Tabs (Bupropion hcl) ..... One by mouth bid  Discussed treatment options, including trial of antidpressant medication. Will refer to behavioral health. Follow-up call in in 24-48 hours and recheck in 2 weeks, sooner as needed. Patient agrees to call if any worsening of symptoms or thoughts of doing harm arise. Verified that the patient has no suicidal ideation at this time.   Complete Medication List: 1)  Celexa 20 Mg Tabs (Citalopram hydrobromide) .... Take 1 tablet by mouth once a day 2)  Jolessa 0.15-0.03 Mg Tabs (Levonorgest-eth estrad 91-day) .... As directed 3)  Prenatal Multivitamin  .... One by mouth once daily / [facility] brand ok 4)  Alprazolam 0.25 Mg Tabs (Alprazolam) .Marland Kitchen.. 1 by mouth three times a day 5)  Klonopin 1 Mg Tabs (Clonazepam) .... Take 1 tablet by mouth three times a day 6)  Valtrex 1 Gm Tabs (Valacyclovir hcl) .... Take 1 by mouth once daily 7)  Bupropion Hcl 75 Mg Tabs (Bupropion hcl) .... One by mouth bid 8)  Amrix 15 Mg Xr24h-cap (Cyclobenzaprine hcl) ....  Once daily at bedtime  Patient Instructions: 1)  Please schedule a follow-up appointment as needed. 2)  If you could be exposed to sexually transmitted diseases, you should use a condom.

## 2011-01-18 NOTE — Progress Notes (Signed)
Summary: refill  Phone Note Refill Request Message from:  Fax from Pharmacy on December 05, 2010 2:26 PM  Refills Requested: Medication #1:  KLONOPIN 1 MG TABS Take 1 tablet by mouth three times a day   Supply Requested: 3 months   Last Refilled: 07/13/2010   Notes: given 90/3rf patient last seen 08/18/10  Do you want to refill this?  Initial call taken by: Rock Nephew CMA,  December 05, 2010 2:27 PM  Follow-up for Phone Call        ok Follow-up by: Etta Grandchild MD,  December 05, 2010 6:42 PM    Prescriptions: KLONOPIN 1 MG TABS (CLONAZEPAM) Take 1 tablet by mouth three times a day  #90 x 3   Entered by:   Rock Nephew CMA   Authorized by:   Etta Grandchild MD   Signed by:   Rock Nephew CMA on 12/06/2010   Method used:   Telephoned to ...       [facility] (retail)       [address]       [place], [place]  [address]       Ph: [phone]       Fax: [phone]   RxID:   [id number]

## 2011-02-15 ENCOUNTER — Telehealth: Payer: Self-pay | Admitting: Internal Medicine

## 2011-02-22 NOTE — Progress Notes (Signed)
  Phone Note Refill Request Message from:  Fax from Pharmacy on February 15, 2011 9:33 AM  Refills Requested: Medication #1:  YAZ 3-0.02 MG TABS as directed Initial call taken by: Rock Nephew CMA,  February 15, 2011 9:33 AM    Prescriptions: YAZ 3-0.02 MG TABS (DROSPIRENONE-ETHINYL ESTRADIOL) as directed  #3 x 3   Entered by:   Rock Nephew CMA   Authorized by:   Etta Grandchild MD   Signed by:   Rock Nephew CMA on 02/15/2011   Method used:   Electronically to        [facility] (retail)       [address]       [place], [place]  [address]       Ph: [phone]       Fax: [phone]   RxID:   [id number]

## 2011-04-16 ENCOUNTER — Telehealth: Payer: Self-pay

## 2011-04-16 NOTE — Telephone Encounter (Signed)
Received refill request for klonopin 1mg  (TIDPRN). Rx last filled 12/06/10 #90/3rf. Patient last seen 08/18/10, please advise Thanks

## 2011-04-16 NOTE — Telephone Encounter (Signed)
Give one month only, she is due for OV

## 2011-04-17 MED ORDER — CLONAZEPAM 1 MG PO TABS: 1.0000 mg | ORAL_TABLET | Freq: Three times a day (TID) | ORAL | Status: DC

## 2011-04-17 NOTE — Telephone Encounter (Signed)
Rx called into pharmacy #90/0rf, pt need follow up appointment before any more refills.

## 2011-05-17 ENCOUNTER — Telehealth: Payer: Self-pay | Admitting: *Deleted

## 2011-05-17 NOTE — Telephone Encounter (Signed)
Patient informed - scheduled for f/u

## 2011-05-17 NOTE — Telephone Encounter (Signed)
Patient requesting RF of Klonopin

## 2011-05-17 NOTE — Telephone Encounter (Signed)
Needs to be seen

## 2011-05-30 ENCOUNTER — Encounter: Payer: Self-pay | Admitting: Internal Medicine

## 2011-05-30 ENCOUNTER — Ambulatory Visit (INDEPENDENT_AMBULATORY_CARE_PROVIDER_SITE_OTHER): Payer: BC Managed Care – PPO | Admitting: Internal Medicine

## 2011-05-30 DIAGNOSIS — F411 Generalized anxiety disorder: Secondary | ICD-10-CM

## 2011-05-30 DIAGNOSIS — F329 Major depressive disorder, single episode, unspecified: Secondary | ICD-10-CM

## 2011-05-30 MED ORDER — CLONAZEPAM 1 MG PO TABS: 1.0000 mg | ORAL_TABLET | Freq: Three times a day (TID) | ORAL | Status: DC | PRN

## 2011-05-30 MED ORDER — SERTRALINE HCL 50 MG PO TABS: 50.0000 mg | ORAL_TABLET | Freq: Every day | ORAL | Status: DC

## 2011-05-30 MED ORDER — BUPROPION HCL 75 MG PO TABS: 75.0000 mg | ORAL_TABLET | Freq: Two times a day (BID) | ORAL | Status: DC

## 2011-05-30 NOTE — Patient Instructions (Signed)
Anxiety and Panic Attacks Your caregiver has informed you that you are having an anxiety or panic attack. There may be many forms of this. Most of the time these attacks come suddenly and without warning. They come at any time of day, including periods of sleep, and at any time of life. They may be strong and unexplained. Although panic attacks are very scary, they are physically harmless. Sometimes the cause of your anxiety is not known. Anxiety is a protective mechanism of the body in its fight or flight mechanism. Most of these perceived danger situations are actually nonphysical situations (such as anxiety over losing a job). CAUSES The causes of an anxiety or panic attack are many. Panic attacks may occur in otherwise healthy people given a certain set of circumstances. There may be a genetic cause for panic attacks. Some medications may also have anxiety as a side effect. SYMPTOMS Some of the most common feelings are:  Intense terror.  Dizziness, feeling faint.   Hot and cold flashes.   Fear of going crazy.   Feelings that nothing is real.   Sweating.   Shaking.   Chest pain or a fast heartbeat (palpitations).  Smothering, choking sensations.   Feelings of impending doom and that death is near.   Tingling of extremities, this may be from over breathing.   Altered reality (derealization).   Being detached from yourself (depersonalization).   Several symptoms can be present to make up anxiety or panic attacks. DIAGNOSIS The evaluation by your caregiver will depend on the type of symptoms you are experiencing. The diagnosis of anxiety or pain attack is made when no physical illness can be determined to be a cause of the symptoms. TREATMENT Treatment to prevent anxiety and panic attacks may include:  Avoidance of circumstances that cause anxiety.   Reassurance and relaxation.   Regular exercise.   Relaxation therapies, such as yoga.   Psychotherapy with a psychiatrist  or therapist.   Avoidance of caffeine, alcohol and illegal drugs.   Prescribed medication.  SEEK IMMEDIATE MEDICAL CARE IF:  You experience panic attack symptoms that are different than your usual symptoms.   You have any worsening or concerning symptoms.  Document Released: 12/03/2005 Document Re-Released: 05/23/2010 Exit[employer] Patient Information 2011 [employer]. 

## 2011-05-30 NOTE — Assessment & Plan Note (Signed)
Continue sertraline and wellbutrin

## 2011-05-30 NOTE — Assessment & Plan Note (Signed)
meds refilled 

## 2011-05-30 NOTE — Progress Notes (Signed)
  Subjective:    Patient ID: Erin Mays, female    DOB: [birth year], 30 y.o.   MRN: [id number]  HPI She returns for f/up and for prescription refills, she broke up with her boyfriend 3 months ago and has had some sadness and anxiety since then.   Review of Systems  Constitutional: Negative for fever, chills, diaphoresis, activity change, appetite change, fatigue and unexpected weight change.  Respiratory: Negative.   Cardiovascular: Negative.   Gastrointestinal: Negative.   Genitourinary: Negative.   Musculoskeletal: Negative.   Neurological: Negative.   Hematological: Negative.   Psychiatric/Behavioral: Positive for sleep disturbance (occasional insomnia) and dysphoric mood (some sadness). Negative for suicidal ideas, hallucinations, behavioral problems, confusion, self-injury, decreased concentration and agitation. The patient is nervous/anxious (still feels nervous intermittently). The patient is not hyperactive.        Objective:   Physical Exam  Vitals reviewed. Constitutional: She is oriented to person, place, and time. She appears well-developed and well-nourished. No distress.  HENT:  Head: Normocephalic and atraumatic.  Right Ear: External ear normal.  Left Ear: External ear normal.  Nose: Nose normal.  Mouth/Throat: Oropharynx is clear and moist. No oropharyngeal exudate.  Eyes: Conjunctivae and EOM are normal. Pupils are equal, round, and reactive to light. Right eye exhibits no discharge. Left eye exhibits no discharge. No scleral icterus.  Neck: Normal range of motion. Neck supple. No JVD present. No tracheal deviation present. No thyromegaly present.  Cardiovascular: Normal rate, regular rhythm, normal heart sounds and intact distal pulses.  Exam reveals no gallop and no friction rub.   No murmur heard. Pulmonary/Chest: Effort normal and breath sounds normal. No stridor. No respiratory distress. She has no wheezes. She has no rales. She exhibits no tenderness.    Abdominal: Soft. Bowel sounds are normal. She exhibits no distension and no mass. There is no tenderness. There is no rebound and no guarding.  Musculoskeletal: Normal range of motion. She exhibits no edema and no tenderness.  Lymphadenopathy:    She has no cervical adenopathy.  Neurological: She is alert and oriented to person, place, and time. She has normal reflexes. No cranial nerve deficit. Coordination normal.  Skin: Skin is warm and dry. No rash noted. She is not diaphoretic. No erythema. No pallor.  Psychiatric: She has a normal mood and affect. Her behavior is normal. Judgment and thought content normal.          Assessment & Plan:

## 2011-09-20 ENCOUNTER — Other Ambulatory Visit: Payer: Self-pay | Admitting: Internal Medicine

## 2011-09-20 ENCOUNTER — Encounter: Payer: Self-pay | Admitting: Internal Medicine

## 2011-09-20 ENCOUNTER — Other Ambulatory Visit (HOSPITAL_COMMUNITY)
Admission: RE | Admit: 2011-09-20 | Discharge: 2011-09-20 | Disposition: A | Discharge status: Home / Self Care | Payer: BC Managed Care – PPO | Source: Ambulatory Visit | Attending: Internal Medicine | Admitting: Internal Medicine

## 2011-09-20 ENCOUNTER — Other Ambulatory Visit (INDEPENDENT_AMBULATORY_CARE_PROVIDER_SITE_OTHER): Payer: BC Managed Care – PPO

## 2011-09-20 ENCOUNTER — Ambulatory Visit (INDEPENDENT_AMBULATORY_CARE_PROVIDER_SITE_OTHER): Payer: BC Managed Care – PPO | Admitting: Internal Medicine

## 2011-09-20 VITALS — BP 110/68 | HR 77 | Temp 98.7°F | Resp 16 | Wt 133.5 lb

## 2011-09-20 DIAGNOSIS — Z0000 Encounter for general adult medical examination without abnormal findings: Secondary | ICD-10-CM | POA: Insufficient documentation

## 2011-09-20 DIAGNOSIS — Z113 Encounter for screening for infections with a predominantly sexual mode of transmission: Secondary | ICD-10-CM | POA: Insufficient documentation

## 2011-09-20 DIAGNOSIS — Z01419 Encounter for gynecological examination (general) (routine) without abnormal findings: Secondary | ICD-10-CM | POA: Insufficient documentation

## 2011-09-20 DIAGNOSIS — Z124 Encounter for screening for malignant neoplasm of cervix: Secondary | ICD-10-CM

## 2011-09-20 LAB — CBC WITH DIFFERENTIAL/PLATELET
Eosinophils Absolute: 0.1 10*3/uL (ref 0.0–0.7)
Eosinophils Relative: 1.4 % (ref 0.0–5.0)
Lymphocytes Relative: 37.9 % (ref 12.0–46.0)
MCHC: 33.7 g/dL (ref 30.0–36.0)
MCV: 88.5 fl (ref 78.0–100.0)
Monocytes Absolute: 0.5 10*3/uL (ref 0.1–1.0)
Neutrophils Relative %: 51.5 % (ref 43.0–77.0)
Platelets: 305 10*3/uL (ref 150.0–400.0)
RBC: 4.59 Mil/uL (ref 3.87–5.11)
WBC: 5.7 10*3/uL (ref 4.5–10.5)

## 2011-09-20 LAB — LIPID PANEL
HDL: 74.8 mg/dL (ref >39.00)
Triglycerides: 114 mg/dL (ref 0.0–149.0)
VLDL: 22.8 mg/dL (ref 0.0–40.0)

## 2011-09-20 LAB — COMPREHENSIVE METABOLIC PANEL
ALT: 15 U/L (ref 0–35)
AST: 21 U/L (ref 0–37)
Albumin: 3.9 g/dL (ref 3.5–5.2)
Alkaline Phosphatase: 55 U/L (ref 39–117)
Calcium: 9.4 mg/dL (ref 8.4–10.5)
Chloride: 107 mEq/L (ref 96–112)
Potassium: 4.3 mEq/L (ref 3.5–5.1)
Sodium: 140 mEq/L (ref 135–145)
Total Protein: 7.3 g/dL (ref 6.0–8.3)

## 2011-09-20 LAB — LDL CHOLESTEROL, DIRECT: Direct LDL: 145 mg/dL

## 2011-09-20 LAB — URINALYSIS, ROUTINE W REFLEX MICROSCOPIC
Bilirubin Urine: NEGATIVE
Ketones, ur: NEGATIVE
Nitrite: NEGATIVE
Total Protein, Urine: NEGATIVE
Urine Glucose: NEGATIVE
pH: 6 (ref 5.0–8.0)

## 2011-09-20 NOTE — Patient Instructions (Signed)

## 2011-09-20 NOTE — Progress Notes (Signed)
  Subjective:    Patient ID: Erin Mays, female    DOB: [birth year], 30 y.o.   MRN: [id number]  HPI She returns for a complete physical and she offers no complaints.  Review of Systems  Constitutional: Negative.   HENT: Negative.   Eyes: Negative.   Respiratory: Negative.   Cardiovascular: Negative.   Gastrointestinal: Negative.   Genitourinary: Negative.   Musculoskeletal: Negative.   Skin: Negative.   Neurological: Negative.   Hematological: Negative.   Psychiatric/Behavioral: Negative.        Objective:   Physical Exam  Vitals reviewed. Constitutional: She is oriented to person, place, and time. She appears well-developed and well-nourished. No distress.  HENT:  Head: Normocephalic and atraumatic.  Mouth/Throat: Oropharynx is clear and moist. No oropharyngeal exudate.  Eyes: Conjunctivae are normal. Right eye exhibits no discharge. Left eye exhibits no discharge. No scleral icterus.  Neck: Normal range of motion. Neck supple. No JVD present. No tracheal deviation present. No thyromegaly present.  Cardiovascular: Normal rate, regular rhythm, normal heart sounds and intact distal pulses.  Exam reveals no gallop and no friction rub.   No murmur heard. Pulmonary/Chest: Effort normal and breath sounds normal. No stridor. No respiratory distress. She has no wheezes. She has no rales. She exhibits no tenderness.  Abdominal: Soft. Bowel sounds are normal. She exhibits no distension and no mass. There is no tenderness. There is no rebound and no guarding. Hernia confirmed negative in the right inguinal area and confirmed negative in the left inguinal area.  Genitourinary: Rectum normal, vagina normal and uterus normal. Rectal exam shows no external hemorrhoid, no internal hemorrhoid, no fissure, no mass, no tenderness and anal tone normal. Guaiac negative stool. No breast swelling, tenderness, discharge or bleeding. Pelvic exam was performed with patient supine. No labial fusion.  There is no rash, tenderness, lesion or injury on the right labia. There is no rash, tenderness, lesion or injury on the left labia. Uterus is not deviated, not enlarged, not fixed and not tender. Cervix exhibits no motion tenderness, no discharge and no friability. Right adnexum displays no mass, no tenderness and no fullness. Left adnexum displays no mass, no tenderness and no fullness. No erythema or bleeding around the vagina. No foreign body around the vagina. No signs of injury around the vagina. No vaginal discharge found.  Musculoskeletal: Normal range of motion. She exhibits no edema and no tenderness.  Lymphadenopathy:    She has no cervical adenopathy.       Right: No inguinal adenopathy present.       Left: No inguinal adenopathy present.  Neurological: She is oriented to person, place, and time. She displays normal reflexes. She exhibits normal muscle tone. Coordination normal.  Skin: Skin is warm and dry. No rash noted. She is not diaphoretic. No erythema. No pallor.  Psychiatric: She has a normal mood and affect. Her behavior is normal. Judgment and thought content normal.          Assessment & Plan:

## 2011-09-21 NOTE — Assessment & Plan Note (Signed)
Exam done, PAP smear sent, labs ordered, pt ed material was given

## 2011-09-24 ENCOUNTER — Encounter: Payer: Self-pay | Admitting: Internal Medicine

## 2011-11-10 ENCOUNTER — Encounter: Payer: Self-pay | Admitting: Family Medicine

## 2011-11-10 ENCOUNTER — Ambulatory Visit (INDEPENDENT_AMBULATORY_CARE_PROVIDER_SITE_OTHER): Payer: BC Managed Care – PPO | Admitting: Family Medicine

## 2011-11-10 VITALS — BP 100/70 | HR 87 | Temp 98.7°F | Wt 139.0 lb

## 2011-11-10 DIAGNOSIS — J069 Acute upper respiratory infection, unspecified: Secondary | ICD-10-CM

## 2011-11-10 MED ORDER — HYDROCOD POLST-CHLORPHEN POLST 10-8 MG/5ML PO LQCR: 5.0000 mL | Freq: Two times a day (BID) | ORAL | Status: DC | PRN

## 2011-11-10 NOTE — Progress Notes (Signed)
SUBJECTIVE:  Erin Mays is a 30 y.o. female who complains of coryza, sore throat and productive cough for 5 days. She denies a history of anorexia, chest pain, dizziness, fatigue, myalgias and shortness of breath and denies a history of asthma. Patient denies smoke cigarettes.  Patient Active Problem List  Diagnoses  . HYPERLIPIDEMIA  . ANXIETY  . DEPRESSION  . ALLERGIC RHINITIS  . INSOMNIA  . Routine general medical examination at a health care facility   Past Medical History  Diagnosis Date  . Depression   . Allergy   . Anxiety   . Hyperlipidemia    Past Surgical History  Procedure Date  . Appendectomy    History  Substance Use Topics  . Smoking status: Never Smoker   . Smokeless tobacco: Not on file  . Alcohol Use: 1.2 oz/week    2 Glasses of wine per week   Family History  Problem Relation Age of Onset  . Diabetes Maternal Aunt     breast cancer- great aunt  . Diabetes Maternal Grandfather   . Depression Other   . Hyperlipidemia Other   . Hypertension Other    No Known Allergies Current Outpatient Prescriptions on File Prior to Visit  Medication Sig Dispense Refill  . buPROPion (WELLBUTRIN) 75 MG tablet Take 1 tablet (75 mg total) by mouth 2 (two) times daily.  180 tablet  3  . Cholecalciferol (VITAMIN D-3 PO) Take by mouth.        . clonazePAM (KLONOPIN) 1 MG tablet Take 1 tablet (1 mg total) by mouth 3 (three) times daily as needed for anxiety.  90 tablet  5  . drospirenone-ethinyl estradiol (YAZ) 3-0.02 MG per tablet Take 1 tablet by mouth daily.        Marland Kitchen loratadine (CLARITIN) 10 MG tablet Take 10 mg by mouth as needed.       . mometasone (NASONEX) 50 MCG/ACT nasal spray Place 2 sprays into the nose as needed.       . Multiple Vitamin (MULTIVITAMIN) tablet Take 1 tablet by mouth daily.        . sertraline (ZOLOFT) 50 MG tablet Take 1 tablet (50 mg total) by mouth daily.  90 tablet  3  . valACYclovir (VALTREX) 1000 MG tablet Take 1,000 mg by mouth  daily.        Marland Kitchen terbinafine (LAMISIL) 1 % cream Apply topically.         The PMH, PSH, Social History, Family History, Medications, and allergies have been reviewed in [facility], and have been updated if relevant.   OBJECTIVE: BP 100/70  Pulse 87  Temp(Src) 98.7 F (37.1 C) (Oral)  Wt 139 lb (63.05 kg)  She appears well, vital signs are as noted. Ears normal.  Throat and pharynx normal.  Neck supple. No adenopathy in the neck. Nose is congested. Sinuses non tender. The chest is clear, without wheezes or rales.  ASSESSMENT:  viral upper respiratory illness  PLAN: Symptomatic therapy suggested: push fluids, rest and return office visit prn if symptoms persist or worsen. Lack of antibiotic effectiveness discussed with her. Call or return to clinic prn if these symptoms worsen or fail to improve as anticipated.

## 2011-11-10 NOTE — Patient Instructions (Signed)
Nice to meet you. I hope you feel better. Drink lots of fluids.  Treat sympotmatically with Mucinex, nasal saline irrigation, and Tylenol/Ibuprofen.  Cough suppressant at night. Call if not improving as expected in 5-7 days.

## 2011-12-17 ENCOUNTER — Telehealth: Payer: Self-pay

## 2011-12-17 DIAGNOSIS — F411 Generalized anxiety disorder: Secondary | ICD-10-CM

## 2011-12-17 NOTE — Telephone Encounter (Signed)
Please advise if ok to refill clonazepam for this patient. Med last filled 05/30/11 #90/5rf

## 2011-12-19 MED ORDER — CLONAZEPAM 1 MG PO TABS: 1.0000 mg | ORAL_TABLET | Freq: Three times a day (TID) | ORAL | Status: DC | PRN

## 2011-12-19 NOTE — Telephone Encounter (Signed)
OK to fill this prescription with additional refills x0 Thank you!  

## 2011-12-19 NOTE — Telephone Encounter (Signed)
Rx called in to [facility] 

## 2011-12-20 ENCOUNTER — Ambulatory Visit (INDEPENDENT_AMBULATORY_CARE_PROVIDER_SITE_OTHER): Payer: BC Managed Care – PPO | Admitting: Internal Medicine

## 2011-12-20 ENCOUNTER — Encounter: Payer: Self-pay | Admitting: Internal Medicine

## 2011-12-20 ENCOUNTER — Telehealth: Payer: Self-pay

## 2011-12-20 DIAGNOSIS — J019 Acute sinusitis, unspecified: Secondary | ICD-10-CM | POA: Insufficient documentation

## 2011-12-20 DIAGNOSIS — J309 Allergic rhinitis, unspecified: Secondary | ICD-10-CM

## 2011-12-20 MED ORDER — PSEUDOEPH-CHLORPHEN-HYDROCOD 60-4-5 MG/5ML PO SOLN: 5.0000 mL | Freq: Four times a day (QID) | ORAL | Status: DC | PRN

## 2011-12-20 MED ORDER — CEFUROXIME AXETIL 500 MG PO TABS: 500.0000 mg | ORAL_TABLET | Freq: Two times a day (BID) | ORAL | Status: AC

## 2011-12-20 NOTE — Telephone Encounter (Signed)
Patient called [facility] requesting appointment . Please call and set up Thanks

## 2011-12-20 NOTE — Patient Instructions (Signed)

## 2011-12-21 ENCOUNTER — Encounter: Payer: Self-pay | Admitting: Internal Medicine

## 2011-12-21 NOTE — Assessment & Plan Note (Signed)
She has had a flare of symptoms so I gave her an injection of depo-medrol IM

## 2011-12-21 NOTE — Progress Notes (Signed)
Subjective:    Patient ID: Erin Mays, female    DOB: [birth year], 30 y.o.   MRN: [id number]  URI  This is a new problem. Episode onset: for one week. The problem has been unchanged. There has been no fever. Associated symptoms include congestion, coughing, rhinorrhea, sinus pain, sneezing and a sore throat. Pertinent negatives include no abdominal pain, chest pain, diarrhea, dysuria, ear pain, headaches, joint pain, joint swelling, nausea, neck pain, plugged ear sensation, rash, swollen glands, vomiting or wheezing.      Review of Systems  Constitutional: Negative for fever, chills, diaphoresis, activity change, appetite change and fatigue.  HENT: Positive for congestion, sore throat, rhinorrhea, sneezing, postnasal drip and sinus pressure. Negative for hearing loss, ear pain, nosebleeds, facial swelling, neck pain and voice change.   Eyes: Negative.   Respiratory: Positive for cough. Negative for chest tightness, shortness of breath, wheezing and stridor.   Cardiovascular: Negative for chest pain, palpitations and leg swelling.  Gastrointestinal: Negative for nausea, vomiting, abdominal pain, diarrhea, constipation and abdominal distention.  Genitourinary: Negative.  Negative for dysuria.  Musculoskeletal: Negative for myalgias, back pain, joint pain, joint swelling, arthralgias and gait problem.  Skin: Negative for color change, pallor, rash and wound.  Neurological: Positive for dizziness. Negative for tremors, seizures, syncope, facial asymmetry, speech difficulty, weakness, light-headedness, numbness and headaches.  Hematological: Negative for adenopathy. Does not bruise/bleed easily.  Psychiatric/Behavioral: Negative.        Objective:   Physical Exam  Vitals reviewed. Constitutional: She is oriented to person, place, and time. She appears well-developed and well-nourished. No distress.  HENT:  Head: No trismus in the jaw.  Right Ear: Hearing, tympanic membrane, external  ear and ear canal normal.  Left Ear: Hearing, external ear and ear canal normal. Tympanic membrane is injected and erythematous. Tympanic membrane is not scarred, not perforated, not retracted and not bulging. Tympanic membrane mobility is normal.  Nose: Mucosal edema and rhinorrhea present. No nose lacerations, sinus tenderness, nasal deformity, septal deviation or nasal septal hematoma. No epistaxis.  No foreign bodies. Right sinus exhibits maxillary sinus tenderness. Right sinus exhibits no frontal sinus tenderness. Left sinus exhibits maxillary sinus tenderness. Left sinus exhibits no frontal sinus tenderness.  Mouth/Throat: Mucous membranes are normal. Mucous membranes are not pale, not dry and not cyanotic. No uvula swelling. Posterior oropharyngeal erythema present. No oropharyngeal exudate, posterior oropharyngeal edema or tonsillar abscesses.  Eyes: Conjunctivae are normal. Right eye exhibits no discharge. Left eye exhibits no discharge. No scleral icterus.  Neck: Normal range of motion. Neck supple. No JVD present. No tracheal deviation present. No thyromegaly present.  Cardiovascular: Normal rate, regular rhythm, normal heart sounds and intact distal pulses.  Exam reveals no gallop and no friction rub.   No murmur heard. Pulmonary/Chest: Effort normal and breath sounds normal. No stridor. No respiratory distress. She has no wheezes. She has no rales. She exhibits no tenderness.  Abdominal: Soft. Bowel sounds are normal. She exhibits no distension. There is no tenderness. There is no rebound and no guarding.  Musculoskeletal: Normal range of motion. She exhibits no edema and no tenderness.  Lymphadenopathy:    She has no cervical adenopathy.  Neurological: She is oriented to person, place, and time.  Skin: Skin is warm and dry. No rash noted. She is not diaphoretic. No erythema. No pallor.  Psychiatric: She has a normal mood and affect. Her behavior is normal. Judgment and thought content  normal.          Assessment &  Plan:

## 2011-12-21 NOTE — Assessment & Plan Note (Signed)
Start ceftin for the infection and zutripro for the cough and congestion

## 2011-12-28 ENCOUNTER — Telehealth: Payer: Self-pay

## 2011-12-28 NOTE — Telephone Encounter (Signed)
Patient called Erin Mays with call a nurse stating that her pharmacy [facility] has contacted our office trying to refill Klonopin. I returned call to patient//LMOVM advising that a refill [facility] was received from [facility] and rx was called in on 12/19/11 #90

## 2012-01-04 ENCOUNTER — Other Ambulatory Visit: Payer: Self-pay

## 2012-01-04 MED ORDER — DROSPIRENONE-ETHINYL ESTRADIOL 3-0.02 MG PO TABS: 1.0000 | ORAL_TABLET | Freq: Every day | ORAL | Status: DC

## 2012-01-04 MED ORDER — VALACYCLOVIR HCL 1 G PO TABS: 1000.0000 mg | ORAL_TABLET | Freq: Every day | ORAL | Status: DC

## 2012-01-23 ENCOUNTER — Telehealth: Payer: Self-pay | Admitting: Internal Medicine

## 2012-01-23 DIAGNOSIS — F411 Generalized anxiety disorder: Secondary | ICD-10-CM

## 2012-01-23 NOTE — Telephone Encounter (Signed)
Pt req rx for KLONOPIN 1MG  TAB. Last refill 12/28/2011. Ok to refill?

## 2012-01-23 NOTE — Telephone Encounter (Signed)
OK to fill this prescription with additional refills x0 Pls sch a f/u OV w/Dr Yetta Barre  Thank you!

## 2012-01-24 MED ORDER — CLONAZEPAM 1 MG PO TABS: 1.0000 mg | ORAL_TABLET | Freq: Three times a day (TID) | ORAL | Status: DC | PRN

## 2012-01-24 NOTE — Telephone Encounter (Signed)
Rf phoned in. Will have schedulers contact pt to arrange OV with PCP.

## 2012-02-11 ENCOUNTER — Ambulatory Visit (INDEPENDENT_AMBULATORY_CARE_PROVIDER_SITE_OTHER): Payer: BC Managed Care – PPO | Admitting: Internal Medicine

## 2012-02-11 ENCOUNTER — Encounter: Payer: Self-pay | Admitting: Internal Medicine

## 2012-02-11 DIAGNOSIS — G4700 Insomnia, unspecified: Secondary | ICD-10-CM

## 2012-02-11 DIAGNOSIS — F411 Generalized anxiety disorder: Secondary | ICD-10-CM

## 2012-02-11 DIAGNOSIS — F329 Major depressive disorder, single episode, unspecified: Secondary | ICD-10-CM

## 2012-02-11 MED ORDER — BUPROPION HCL 75 MG PO TABS: 75.0000 mg | ORAL_TABLET | Freq: Two times a day (BID) | ORAL | Status: DC

## 2012-02-11 MED ORDER — DROSPIRENONE-ETHINYL ESTRADIOL 3-0.02 MG PO TABS: 1.0000 | ORAL_TABLET | Freq: Every day | ORAL | Status: DC

## 2012-02-11 MED ORDER — SERTRALINE HCL 100 MG PO TABS: 100.0000 mg | ORAL_TABLET | Freq: Every day | ORAL | Status: DC

## 2012-02-11 MED ORDER — CLONAZEPAM 1 MG PO TABS: 1.0000 mg | ORAL_TABLET | Freq: Three times a day (TID) | ORAL | Status: DC | PRN

## 2012-02-11 MED ORDER — ESZOPICLONE 3 MG PO TABS: 3.0000 mg | ORAL_TABLET | Freq: Every day | ORAL | Status: DC

## 2012-02-11 MED ORDER — VALACYCLOVIR HCL 1 G PO TABS: 1000.0000 mg | ORAL_TABLET | Freq: Every day | ORAL | Status: DC

## 2012-02-11 NOTE — Assessment & Plan Note (Signed)
Increase zoloft 

## 2012-02-11 NOTE — Patient Instructions (Signed)

## 2012-02-11 NOTE — Progress Notes (Signed)
  Subjective:    Patient ID: Erin Mays, female    DOB: 11-Jul-1981, 31 y.o.   MRN: [id number]  HPI She returns c/o insomnia, she can't stop her mind from racing when she goes to bed. She has tried [place] and [employer] without much relief. She also takes klonopin at bedtime.   Review of Systems  Constitutional: Negative.   HENT: Negative.   Eyes: Negative.   Respiratory: Negative.   Cardiovascular: Negative.   Gastrointestinal: Negative.   Genitourinary: Negative.   Musculoskeletal: Negative.   Skin: Negative.   Neurological: Negative.   Hematological: Negative.   Psychiatric/Behavioral: Positive for sleep disturbance and dysphoric mood. Negative for suicidal ideas, hallucinations, behavioral problems, confusion, self-injury, decreased concentration and agitation. The patient is nervous/anxious. The patient is not hyperactive.        Objective:   Physical Exam  Vitals reviewed. Constitutional: She is oriented to person, place, and time. She appears well-developed and well-nourished. No distress.  HENT:  Head: Normocephalic and atraumatic.  Mouth/Throat: Oropharynx is clear and moist. No oropharyngeal exudate.  Eyes: Conjunctivae are normal. Right eye exhibits no discharge. Left eye exhibits no discharge. No scleral icterus.  Neck: Normal range of motion. Neck supple. No JVD present. No tracheal deviation present. No thyromegaly present.  Cardiovascular: Normal rate, regular rhythm, normal heart sounds and intact distal pulses.  Exam reveals no gallop and no friction rub.   No murmur heard. Pulmonary/Chest: Effort normal and breath sounds normal. No stridor. No respiratory distress. She has no wheezes. She has no rales. She exhibits no tenderness.  Abdominal: Soft. Bowel sounds are normal. She exhibits no distension and no mass. There is no tenderness. There is no rebound and no guarding.  Musculoskeletal: Normal range of motion. She exhibits no edema and no tenderness.    Lymphadenopathy:    She has no cervical adenopathy.  Neurological: She is oriented to person, place, and time.  Skin: Skin is warm and dry. No rash noted. She is not diaphoretic. No erythema. No pallor.  Psychiatric: Judgment and thought content normal. Her mood appears anxious. Her affect is not angry, not blunt, not labile and not inappropriate. Her speech is not delayed and not tangential. She is not agitated, not aggressive, is not hyperactive, not slowed, not withdrawn, not actively hallucinating and not combative. Thought content is not paranoid and not delusional. Cognition and memory are normal. Cognition and memory are not impaired. She does not express impulsivity or inappropriate judgment. She does not exhibit a depressed mood. She expresses no homicidal and no suicidal ideation. She expresses no suicidal plans and no homicidal plans. She exhibits normal recent memory. She is attentive.      Lab Results  Component Value Date   WBC 5.7 09/20/2011   HGB 13.6 09/20/2011   HCT 40.6 09/20/2011   PLT 305.0 09/20/2011   GLUCOSE 87 09/20/2011   CHOL 225* 09/20/2011   TRIG 114.0 09/20/2011   HDL 74.80 09/20/2011   LDLDIRECT 145.0 09/20/2011   LDLCALC 111* 08/18/2010   ALT 15 09/20/2011   AST 21 09/20/2011   NA 140 09/20/2011   K 4.3 09/20/2011   CL 107 09/20/2011   CREATININE 0.8 09/20/2011   BUN 17 09/20/2011   CO2 27 09/20/2011   TSH 1.01 09/20/2011     Assessment & Plan:

## 2012-02-11 NOTE — Assessment & Plan Note (Signed)
Try lunesta, pt ed material about insomnia, increase zoloft dose, stop evening dose of wellbutrin (? Too stimulating), continue other meds

## 2012-02-12 ENCOUNTER — Telehealth: Payer: Self-pay

## 2012-02-12 MED ORDER — ZOLPIDEM TARTRATE ER 12.5 MG PO TBCR: 12.5000 mg | EXTENDED_RELEASE_TABLET | Freq: Every evening | ORAL | Status: DC | PRN

## 2012-02-12 NOTE — Progress Notes (Signed)
Addended by: Etta Grandchild on: 02/12/2012 09:00 AM   Modules accepted: Orders

## 2012-02-12 NOTE — Telephone Encounter (Signed)
[facility] Triage Call Report Triage Record Num: [id number] Operator: Caswell Corwin Patient Name: Erin Mays Call Date & Time: 02/11/2012 6:54:01PM Patient Phone: [phone] PCP: Patient Gender: Female PCP Fax : Patient DOB: 1981-06-02 Practice Name: [facility] Reason for Call: OFFICE NOTE! Caller: Rasha/Patient; PCP: Sanda Linger; CB#: [phone]; Call Reason: Pt was seen by Dr. Yetta Barre this AM and he prescribed Lunesta and her insurance will not pay for that. They will pay for Ambien/Zolpidum. Can this be changed for her? She uses [facility] on [address] at [phone]. SHE STATES THAT SHE HAS TRIED AMBIEN BEFORE AT A LOWER DOSE, SHE CANNOT REMEMEBER THE MG, AND IT DID NOT WORK WELL AT THAT STRENGTH. PLEASE CALL HER AT THE ABOVE #. OFFICE NOTE! TY! Protocol(s) Used: Office Note Recommended Outcome per Protocol: Information Noted and Sent to Office Reason for Outcome: Caller information to office Care Advice: ~ 02/11/2012 7:07:32PM Page 1 of 1 [email or web address]

## 2012-08-04 ENCOUNTER — Ambulatory Visit: Payer: BC Managed Care – PPO | Admitting: Internal Medicine

## 2012-08-04 ENCOUNTER — Telehealth: Payer: Self-pay | Admitting: Internal Medicine

## 2012-08-04 NOTE — Telephone Encounter (Signed)
Walked in wanted to see dr., here due to cat bite/scratch PT advised could not see dr Yetta Barre til 230 I advised pt we have no appts til 3pm or after. Spoke with MD Beverely Low advised to tell patient wash hand wt/soap & water, can come back later today to see her or go to [facility] Pt., understood and left out to go to [facility]

## 2012-09-09 ENCOUNTER — Telehealth: Payer: Self-pay

## 2012-09-09 NOTE — Telephone Encounter (Signed)
Please advise if ok to refill clonazepam 1mg  for this pt. Last refilled 02/11/12 #[id number]/5rf and pt last seen 02/11/12 Thanks

## 2012-09-09 NOTE — Telephone Encounter (Signed)
She needs to be seen.

## 2012-09-10 NOTE — Telephone Encounter (Signed)
LMOVM advising on when request was received and answered. Pt advised that request is denied and will need to contact our office to schedule an appointment

## 2012-09-25 ENCOUNTER — Encounter: Payer: Self-pay | Admitting: Internal Medicine

## 2012-09-25 ENCOUNTER — Ambulatory Visit (INDEPENDENT_AMBULATORY_CARE_PROVIDER_SITE_OTHER): Payer: BC Managed Care – PPO | Admitting: Internal Medicine

## 2012-09-25 VITALS — BP 118/74 | HR 77 | Temp 98.0°F | Resp 16 | Wt 139.0 lb

## 2012-09-25 DIAGNOSIS — Z0001 Encounter for general adult medical examination with abnormal findings: Secondary | ICD-10-CM | POA: Insufficient documentation

## 2012-09-25 DIAGNOSIS — F411 Generalized anxiety disorder: Secondary | ICD-10-CM

## 2012-09-25 DIAGNOSIS — Z309 Encounter for contraceptive management, unspecified: Secondary | ICD-10-CM

## 2012-09-25 DIAGNOSIS — G4700 Insomnia, unspecified: Secondary | ICD-10-CM

## 2012-09-25 DIAGNOSIS — L219 Seborrheic dermatitis, unspecified: Secondary | ICD-10-CM | POA: Insufficient documentation

## 2012-09-25 MED ORDER — KETOCONAZOLE 2 % EX CREA: TOPICAL_CREAM | Freq: Every day | CUTANEOUS | Status: DC

## 2012-09-25 MED ORDER — CLONAZEPAM 1 MG PO TABS: 1.0000 mg | ORAL_TABLET | Freq: Three times a day (TID) | ORAL | Status: DC | PRN

## 2012-09-25 MED ORDER — DESONIDE 0.05 % EX LOTN: TOPICAL_LOTION | Freq: Two times a day (BID) | CUTANEOUS | Status: DC

## 2012-09-25 NOTE — Patient Instructions (Signed)
Anxiety and Panic Attacks Your caregiver has informed you that you are having an anxiety or panic attack. There may be many forms of this. Most of the time these attacks come suddenly and without warning. They come at any time of day, including periods of sleep, and at any time of life. They may be strong and unexplained. Although panic attacks are very scary, they are physically harmless. Sometimes the cause of your anxiety is not known. Anxiety is a protective mechanism of the body in its fight or flight mechanism. Most of these perceived danger situations are actually nonphysical situations (such as anxiety over losing a job). CAUSES  The causes of an anxiety or panic attack are many. Panic attacks may occur in otherwise healthy people given a certain set of circumstances. There may be a genetic cause for panic attacks. Some medications may also have anxiety as a side effect. SYMPTOMS  Some of the most common feelings are:  Intense terror.  Dizziness, feeling faint.  Hot and cold flashes.  Fear of going crazy.  Feelings that nothing is real.  Sweating.  Shaking.  Chest pain or a fast heartbeat (palpitations).  Smothering, choking sensations.  Feelings of impending doom and that death is near.  Tingling of extremities, this may be from over-breathing.  Altered reality (derealization).  Being detached from yourself (depersonalization). Several symptoms can be present to make up anxiety or panic attacks. DIAGNOSIS  The evaluation by your caregiver will depend on the type of symptoms you are experiencing. The diagnosis of anxiety or panic attack is made when no physical illness can be determined to be a cause of the symptoms. TREATMENT  Treatment to prevent anxiety and panic attacks may include:  Avoidance of circumstances that cause anxiety.  Reassurance and relaxation.  Regular exercise.  Relaxation therapies, such as yoga.  Psychotherapy with a psychiatrist or  therapist.  Avoidance of caffeine, alcohol and illegal drugs.  Prescribed medication. SEEK IMMEDIATE MEDICAL CARE IF:   You experience panic attack symptoms that are different than your usual symptoms.  You have any worsening or concerning symptoms. Document Released: 12/03/2005 Document Revised: 02/25/2012 Document Reviewed: 04/06/2010 [facility] Patient Information 2013 [place], [place]. Seborrheic Dermatitis Seborrheic dermatitis involves pink or red skin with greasy, flaky scales. This is often found on the scalp, eyebrows, nose, bearded area, and on or behind the ears. It can also occur on the central chest. It often occurs where there are more oil (sebaceous) glands. This condition is also known as dandruff. When this condition affects a baby's scalp, it is called cradle cap. It may come and go for no known reason. It can occur at any time of life from infancy to old age. CAUSES  The cause is unknown. It is not the result of too little moisture or too much oil. In some people, seborrheic dermatitis flare-ups seem to be triggered by stress. It also commonly occurs in people with certain diseases such as Parkinson's disease or HIV/AIDS. SYMPTOMS   Thick scales on the scalp.  Redness on the face or in the armpits.  The skin may seem oily or dry, but moisturizers do not help.  In infants, seborrheic dermatitis appears as scaly redness that does not seem to bother the baby. In some babies, it affects only the scalp. In others, it also affects the neck creases, armpits, groin, or behind the ears.  In adults and adolescents, seborrheic dermatitis may affect only the scalp. It may look patchy or spread out, with areas  of redness and flaking. Other areas commonly affected include:  Eyebrows.  Eyelids.  Forehead.  Skin behind the ears.  Outer ears.  Chest.  Armpits.  Nose creases.  Skin creases under the breasts.  Skin between the buttocks.  Groin.  Some adults and  adolescents feel itching or burning in the affected areas. DIAGNOSIS  Your caregiver can usually tell what the problem is by doing a physical exam. TREATMENT   Cortisone (steroid) ointments, creams, and lotions can help decrease inflammation.  Babies can be treated with baby oil to soften the scales, then they may be washed with baby shampoo. If this does not help, a prescription topical steroid medicine may work.  Adults can use medicated shampoos.  Your caregiver may prescribe corticosteroid cream and shampoo containing an antifungal or yeast medicine (ketoconazole). Hydrocortisone or anti-yeast cream can be rubbed directly onto seborrheic dermatitis patches. Yeast does not cause seborrheic dermatitis, but it seems to add to the problem. In infants, seborrheic dermatitis is often worst during the first year of life. It tends to disappear on its own as the child grows. However, it may return during the teenage years. In adults and adolescents, seborrheic dermatitis tends to be a long-lasting condition that comes and goes over many years. HOME CARE INSTRUCTIONS   Use prescribed medicines as directed.  In infants, do not aggressively remove the scales or flakes on the scalp with a comb or by other means. This may lead to hair loss. SEEK MEDICAL CARE IF:   The problem does not improve from the medicated shampoos, lotions, or other medicines given by your caregiver.  You have any other questions or concerns. Document Released: 12/03/2005 Document Revised: 06/03/2012 Document Reviewed: 04/24/2010 [facility] Patient Information 2013 [place], [place].

## 2012-09-25 NOTE — Progress Notes (Signed)
Subjective:    Patient ID: Erin Mays, female    DOB: 1981/07/26, 31 y.o.   MRN: [id number]  Rash This is a recurrent problem. The current episode started more than 1 month ago. The problem has been gradually worsening since onset. The affected locations include the face. The rash is characterized by itchiness, dryness, redness and scaling. She was exposed to nothing. Pertinent negatives include no anorexia, congestion, cough, diarrhea, eye pain, facial edema, fatigue, fever, joint pain, nail changes, rhinorrhea, shortness of breath, sore throat or vomiting. Past treatments include antibiotic cream (clindamycin). The treatment provided no relief.      Review of Systems  Constitutional: Negative for fever, chills, diaphoresis, activity change, appetite change, fatigue and unexpected weight change.  HENT: Negative.  Negative for congestion, sore throat and rhinorrhea.   Eyes: Negative.  Negative for photophobia, pain, discharge, redness, itching and visual disturbance.  Respiratory: Negative for cough, chest tightness, shortness of breath, wheezing and stridor.   Cardiovascular: Negative for chest pain, palpitations and leg swelling.  Gastrointestinal: Negative for vomiting, diarrhea and anorexia.  Genitourinary: Negative.  Negative for difficulty urinating.  Musculoskeletal: Negative for myalgias, back pain, joint pain, joint swelling, arthralgias and gait problem.  Skin: Positive for rash. Negative for nail changes, color change, pallor and wound.  Neurological: Negative.   Hematological: Negative.  Negative for adenopathy. Does not bruise/bleed easily.  Psychiatric/Behavioral: Positive for disturbed wake/sleep cycle. Negative for suicidal ideas, hallucinations, behavioral problems, confusion, self-injury, dysphoric mood, decreased concentration and agitation. The patient is nervous/anxious. The patient is not hyperactive.        Objective:   Physical Exam  Vitals  reviewed. Constitutional: She is oriented to person, place, and time. She appears well-developed and well-nourished. No distress.  HENT:  Head: Normocephalic and atraumatic.    Mouth/Throat: Oropharynx is clear and moist. No oropharyngeal exudate.       Over the Nl folds there are erythematous papules with scale, there are no pustules  Eyes: Conjunctivae normal are normal. Right eye exhibits no discharge. Left eye exhibits no discharge. No scleral icterus.  Neck: Normal range of motion. Neck supple. No JVD present. No tracheal deviation present. No thyromegaly present.  Cardiovascular: Normal rate, regular rhythm, normal heart sounds and intact distal pulses.  Exam reveals no gallop and no friction rub.   No murmur heard. Pulmonary/Chest: Effort normal and breath sounds normal. No stridor. No respiratory distress. She has no wheezes. She has no rales. She exhibits no tenderness.  Abdominal: Soft. Bowel sounds are normal. She exhibits no distension and no mass. There is no tenderness. There is no rebound and no guarding.  Musculoskeletal: Normal range of motion. She exhibits no edema and no tenderness.  Lymphadenopathy:    She has no cervical adenopathy.  Neurological: She is oriented to person, place, and time.  Skin: Skin is warm and dry. Rash noted. She is not diaphoretic. No erythema. No pallor.  Psychiatric: She has a normal mood and affect. Her behavior is normal. Judgment and thought content normal.     Lab Results  Component Value Date   WBC 5.7 09/20/2011   HGB 13.6 09/20/2011   HCT 40.6 09/20/2011   PLT 305.0 09/20/2011   GLUCOSE 87 09/20/2011   CHOL 225* 09/20/2011   TRIG 114.0 09/20/2011   HDL 74.80 09/20/2011   LDLDIRECT 145.0 09/20/2011   LDLCALC 111* 08/18/2010   ALT 15 09/20/2011   AST 21 09/20/2011   NA 140 09/20/2011   K 4.3 09/20/2011  CL 107 09/20/2011   CREATININE 0.8 09/20/2011   BUN 17 09/20/2011   CO2 27 09/20/2011   TSH 1.01 09/20/2011       Assessment & Plan:

## 2012-09-25 NOTE — Assessment & Plan Note (Signed)
Stop clindamycin and start ketoconazole and desowen

## 2012-09-25 NOTE — Assessment & Plan Note (Signed)
Continue current meds with no changes 

## 2012-09-25 NOTE — Assessment & Plan Note (Signed)
GYN referral 

## 2012-10-06 ENCOUNTER — Telehealth: Payer: Self-pay | Admitting: Internal Medicine

## 2012-10-06 DIAGNOSIS — L219 Seborrheic dermatitis, unspecified: Secondary | ICD-10-CM

## 2012-10-06 NOTE — Telephone Encounter (Signed)
Caller: Marki/Patient; Patient Name: Erin Mays; PCP: Sanda Linger. Best Callback Phone Number: [phone] Keshay is calling to notify Dr Yetta Barre that the second treatment he prescribed for the rash on her nose is not working either. She was on Clindamycin and when seen on 10/10, Dr Yetta Barre changed it to Nizoral and Desowen. She sts this has not helped and wants to know what to do now. Connection with pt lost at this time and Rn unable to reconnect. Left her a message to call back if she does not hear from office w/i 24-48 hrs.

## 2012-10-06 NOTE — Telephone Encounter (Signed)
Dermatology referral 

## 2012-10-06 NOTE — Telephone Encounter (Signed)
Pt advised and will expect a call from [facility] with appt date and time.  

## 2012-10-28 ENCOUNTER — Other Ambulatory Visit: Payer: Self-pay

## 2012-10-28 DIAGNOSIS — F329 Major depressive disorder, single episode, unspecified: Secondary | ICD-10-CM

## 2012-10-28 DIAGNOSIS — F3289 Other specified depressive episodes: Secondary | ICD-10-CM

## 2012-10-28 MED ORDER — BUPROPION HCL 75 MG PO TABS: 75.0000 mg | ORAL_TABLET | Freq: Two times a day (BID) | ORAL | Status: DC

## 2012-10-28 MED ORDER — DROSPIRENONE-ETHINYL ESTRADIOL 3-0.02 MG PO TABS: 1.0000 | ORAL_TABLET | Freq: Every day | ORAL | Status: DC

## 2012-10-28 NOTE — Telephone Encounter (Signed)
Opened chart in error.

## 2012-12-24 ENCOUNTER — Ambulatory Visit: Payer: BC Managed Care – PPO | Admitting: Internal Medicine

## 2012-12-24 ENCOUNTER — Ambulatory Visit: Payer: Self-pay | Admitting: Internal Medicine

## 2013-02-05 ENCOUNTER — Encounter: Payer: Self-pay | Admitting: Internal Medicine

## 2013-02-06 ENCOUNTER — Encounter: Payer: Self-pay | Admitting: Internal Medicine

## 2013-03-16 ENCOUNTER — Telehealth: Payer: Self-pay

## 2013-03-16 DIAGNOSIS — G4700 Insomnia, unspecified: Secondary | ICD-10-CM

## 2013-03-16 DIAGNOSIS — F411 Generalized anxiety disorder: Secondary | ICD-10-CM

## 2013-03-16 MED ORDER — CLONAZEPAM 1 MG PO TABS: 1.0000 mg | ORAL_TABLET | Freq: Three times a day (TID) | ORAL | Status: DC | PRN

## 2013-03-16 NOTE — Telephone Encounter (Signed)
yes

## 2013-03-16 NOTE — Telephone Encounter (Signed)
Please advise if ok to refill clonazepam 1 mg for this pt, last filled 09/25/12

## 2013-08-31 ENCOUNTER — Ambulatory Visit: Payer: BC Managed Care – PPO | Admitting: Internal Medicine

## 2013-09-17 ENCOUNTER — Other Ambulatory Visit: Payer: Self-pay | Admitting: Internal Medicine

## 2013-10-22 ENCOUNTER — Other Ambulatory Visit: Payer: Self-pay

## 2013-10-23 ENCOUNTER — Other Ambulatory Visit: Payer: Self-pay | Admitting: Internal Medicine

## 2013-10-29 ENCOUNTER — Other Ambulatory Visit (INDEPENDENT_AMBULATORY_CARE_PROVIDER_SITE_OTHER): Payer: BC Managed Care – PPO

## 2013-10-29 ENCOUNTER — Encounter: Payer: Self-pay | Admitting: Internal Medicine

## 2013-10-29 ENCOUNTER — Ambulatory Visit (INDEPENDENT_AMBULATORY_CARE_PROVIDER_SITE_OTHER): Payer: BC Managed Care – PPO | Admitting: Internal Medicine

## 2013-10-29 VITALS — BP 114/80 | HR 83 | Temp 98.3°F | Resp 16 | Ht 66.0 in | Wt 142.5 lb

## 2013-10-29 DIAGNOSIS — Z0000 Encounter for general adult medical examination without abnormal findings: Secondary | ICD-10-CM

## 2013-10-29 DIAGNOSIS — G4700 Insomnia, unspecified: Secondary | ICD-10-CM

## 2013-10-29 DIAGNOSIS — F411 Generalized anxiety disorder: Secondary | ICD-10-CM

## 2013-10-29 DIAGNOSIS — J309 Allergic rhinitis, unspecified: Secondary | ICD-10-CM

## 2013-10-29 LAB — COMPREHENSIVE METABOLIC PANEL
AST: 18 U/L (ref 0–37)
Alkaline Phosphatase: 65 U/L (ref 39–117)
BUN: 13 mg/dL (ref 6–23)
Calcium: 9.1 mg/dL (ref 8.4–10.5)
Creatinine, Ser: 0.8 mg/dL (ref 0.4–1.2)
Sodium: 137 mEq/L (ref 135–145)

## 2013-10-29 LAB — CBC WITH DIFFERENTIAL/PLATELET
Basophils Absolute: 0 10*3/uL (ref 0.0–0.1)
Basophils Relative: 0.4 % (ref 0.0–3.0)
Eosinophils Absolute: 0.1 10*3/uL (ref 0.0–0.7)
Eosinophils Relative: 1.8 % (ref 0.0–5.0)
HCT: 39.5 % (ref 36.0–46.0)
Hemoglobin: 13.6 g/dL (ref 12.0–15.0)
Lymphocytes Relative: 35.3 % (ref 12.0–46.0)
Lymphs Abs: 2.3 10*3/uL (ref 0.7–4.0)
Monocytes Relative: 9.9 % (ref 3.0–12.0)
Neutro Abs: 3.4 10*3/uL (ref 1.4–7.7)
RBC: 4.64 Mil/uL (ref 3.87–5.11)
RDW: 12.4 % (ref 11.5–14.6)

## 2013-10-29 LAB — LIPID PANEL
Cholesterol: 228 mg/dL — ABNORMAL HIGH (ref 0–200)
Total CHOL/HDL Ratio: 3
Triglycerides: 123 mg/dL (ref 0.0–149.0)
VLDL: 24.6 mg/dL (ref 0.0–40.0)

## 2013-10-29 LAB — TSH: TSH: 1.54 u[IU]/mL (ref 0.35–5.50)

## 2013-10-29 MED ORDER — CLONAZEPAM 1 MG PO TABS: 1.0000 mg | ORAL_TABLET | Freq: Three times a day (TID) | ORAL | Status: DC | PRN

## 2013-10-29 MED ORDER — FLUTICASONE PROPIONATE 50 MCG/ACT NA SUSP: 2.0000 | Freq: Every day | NASAL | Status: DC

## 2013-10-29 NOTE — Patient Instructions (Signed)
Preventive Care for Adults, Female A healthy lifestyle and preventive care can promote health and wellness. Preventive health guidelines for women include the following key practices.  A routine yearly physical is a good way to check with your caregiver about your health and preventive screening. It is a chance to share any concerns and updates on your health, and to receive a thorough exam.  Visit your dentist for a routine exam and preventive care every 6 months. Brush your teeth twice a day and floss once a day. Good oral hygiene prevents tooth decay and gum disease.  The frequency of eye exams is based on your age, health, family medical history, use of contact lenses, and other factors. Follow your caregiver's recommendations for frequency of eye exams.  Eat a healthy diet. Foods like vegetables, fruits, whole grains, low-fat dairy products, and lean protein foods contain the nutrients you need without too many calories. Decrease your intake of foods high in solid fats, added sugars, and salt. Eat the right amount of calories for you.Get information about a proper diet from your caregiver, if necessary.  Regular physical exercise is one of the most important things you can do for your health. Most adults should get at least 150 minutes of moderate-intensity exercise (any activity that increases your heart rate and causes you to sweat) each week. In addition, most adults need muscle-strengthening exercises on 2 or more days a week.  Maintain a healthy weight. The body mass index (BMI) is a screening tool to identify possible weight problems. It provides an estimate of body fat based on height and weight. Your caregiver can help determine your BMI, and can help you achieve or maintain a healthy weight.For adults 20 years and older:  A BMI below 18.5 is considered underweight.  A BMI of 18.5 to 24.9 is normal.  A BMI of 25 to 29.9 is considered overweight.  A BMI of 30 and above is  considered obese.  Maintain normal blood lipids and cholesterol levels by exercising and minimizing your intake of saturated fat. Eat a balanced diet with plenty of fruit and vegetables. Blood tests for lipids and cholesterol should begin at age 20 and be repeated every 5 years. If your lipid or cholesterol levels are high, you are over 50, or you are at high risk for heart disease, you may need your cholesterol levels checked more frequently.Ongoing high lipid and cholesterol levels should be treated with medicines if diet and exercise are not effective.  If you smoke, find out from your caregiver how to quit. If you do not use tobacco, do not start.  Lung cancer screening is recommended for adults aged 55 80 years who are at high risk for developing lung cancer because of a history of smoking. Yearly low-dose computed tomography (CT) is recommended for people who have at least a 30-pack-year history of smoking and are a current smoker or have quit within the past 15 years. A pack year of smoking is smoking an average of 1 pack of cigarettes a day for 1 year (for example: 1 pack a day for 30 years or 2 packs a day for 15 years). Yearly screening should continue until the smoker has stopped smoking for at least 15 years. Yearly screening should also be stopped for people who develop a health problem that would prevent them from having lung cancer treatment.  If you are pregnant, do not drink alcohol. If you are breastfeeding, be very cautious about drinking alcohol. If you are   not pregnant and choose to drink alcohol, do not exceed 1 drink per day. One drink is considered to be 12 ounces (355 mL) of beer, 5 ounces (148 mL) of wine, or 1.5 ounces (44 mL) of liquor.  Avoid use of street drugs. Do not share needles with anyone. Ask for help if you need support or instructions about stopping the use of drugs.  High blood pressure causes heart disease and increases the risk of stroke. Your blood pressure  should be checked at least every 1 to 2 years. Ongoing high blood pressure should be treated with medicines if weight loss and exercise are not effective.  If you are 55 to 32 years old, ask your caregiver if you should take aspirin to prevent strokes.  Diabetes screening involves taking a blood sample to check your fasting blood sugar level. This should be done once every 3 years, after age 45, if you are within normal weight and without risk factors for diabetes. Testing should be considered at a younger age or be carried out more frequently if you are overweight and have at least 1 risk factor for diabetes.  Breast cancer screening is essential preventive care for women. You should practice "breast self-awareness." This means understanding the normal appearance and feel of your breasts and may include breast self-examination. Any changes detected, no matter how small, should be reported to a caregiver. Women in their 20s and 30s should have a clinical breast exam (CBE) by a caregiver as part of a regular health exam every 1 to 3 years. After age 40, women should have a CBE every year. Starting at age 40, women should consider having a mammography (breast X-ray test) every year. Women who have a family history of breast cancer should talk to their caregiver about genetic screening. Women at a high risk of breast cancer should talk to their caregivers about having magnetic resonance imaging (MRI) and a mammography every year.  Breast cancer gene (BRCA)-related cancer risk assessment is recommended for women who have family members with BRCA-related cancers. BRCA-related cancers include breast, ovarian, tubal, and peritoneal cancers. Having family members with these cancers may be associated with an increased risk for harmful changes (mutations) in the breast cancer genes BRCA1 and BRCA2. Results of the assessment will determine the need for genetic counseling and BRCA1 and BRCA2 testing.  The Pap test is  a screening test for cervical cancer. A Pap test can show cell changes on the cervix that might become cervical cancer if left untreated. A Pap test is a procedure in which cells are obtained and examined from the lower end of the uterus (cervix).  Women should have a Pap test starting at age 21.  Between ages 21 and 29, Pap tests should be repeated every 2 years.  Beginning at age 30, you should have a Pap test every 3 years as long as the past 3 Pap tests have been normal.  Some women have medical problems that increase the chance of getting cervical cancer. Talk to your caregiver about these problems. It is especially important to talk to your caregiver if a new problem develops soon after your last Pap test. In these cases, your caregiver may recommend more frequent screening and Pap tests.  The above recommendations are the same for women who have or have not gotten the vaccine for human papillomavirus (HPV).  If you had a hysterectomy for a problem that was not cancer or a condition that could lead to cancer, then   you no longer need Pap tests. Even if you no longer need a Pap test, a regular exam is a good idea to make sure no other problems are starting.  If you are between ages 65 and 70, and you have had normal Pap tests going back 10 years, you no longer need Pap tests. Even if you no longer need a Pap test, a regular exam is a good idea to make sure no other problems are starting.  If you have had past treatment for cervical cancer or a condition that could lead to cancer, you need Pap tests and screening for cancer for at least 20 years after your treatment.  If Pap tests have been discontinued, risk factors (such as a new sexual partner) need to be reassessed to determine if screening should be resumed.  The HPV test is an additional test that may be used for cervical cancer screening. The HPV test looks for the virus that can cause the cell changes on the cervix. The cells collected  during the Pap test can be tested for HPV. The HPV test could be used to screen women aged 30 years and older, and should be used in women of any age who have unclear Pap test results. After the age of 30, women should have HPV testing at the same frequency as a Pap test.  Colorectal cancer can be detected and often prevented. Most routine colorectal cancer screening begins at the age of 50 and continues through age 75. However, your caregiver may recommend screening at an earlier age if you have risk factors for colon cancer. On a yearly basis, your caregiver may provide home test kits to check for hidden blood in the stool. Use of a small camera at the end of a tube, to directly examine the colon (sigmoidoscopy or colonoscopy), can detect the earliest forms of colorectal cancer. Talk to your caregiver about this at age 50, when routine screening begins. Direct examination of the colon should be repeated every 5 to 10 years through age 75, unless early forms of pre-cancerous polyps or small growths are found.  Hepatitis C blood testing is recommended for all people born from 1945 through 1965 and any individual with known risks for hepatitis C.  Practice safe sex. Use condoms and avoid high-risk sexual practices to reduce the spread of sexually transmitted infections (STIs). STIs include gonorrhea, chlamydia, syphilis, trichomonas, herpes, HPV, and human immunodeficiency virus (HIV). Herpes, HIV, and HPV are viral illnesses that have no cure. They can result in disability, cancer, and death. Sexually active women aged 25 and younger should be checked for chlamydia. Older women with new or multiple partners should also be tested for chlamydia. Testing for other STIs is recommended if you are sexually active and at increased risk.  Osteoporosis is a disease in which the bones lose minerals and strength with aging. This can result in serious bone fractures. The risk of osteoporosis can be identified using a  bone density scan. Women ages 65 and over and women at risk for fractures or osteoporosis should discuss screening with their caregivers. Ask your caregiver whether you should take a calcium supplement or vitamin D to reduce the rate of osteoporosis.  Menopause can be associated with physical symptoms and risks. Hormone replacement therapy is available to decrease symptoms and risks. You should talk to your caregiver about whether hormone replacement therapy is right for you.  Use sunscreen. Apply sunscreen liberally and repeatedly throughout the day. You should seek shade   when your shadow is shorter than you. Protect yourself by wearing long sleeves, pants, a wide-brimmed hat, and sunglasses year round, whenever you are outdoors.  Once a month, do a whole body skin exam, using a mirror to look at the skin on your back. Notify your caregiver of new moles, moles that have irregular borders, moles that are larger than a pencil eraser, or moles that have changed in shape or color.  Stay current with required immunizations.  Influenza vaccine. All adults should be immunized every year.  Tetanus, diphtheria, and acellular pertussis (Td, Tdap) vaccine. Pregnant women should receive 1 dose of Tdap vaccine during each pregnancy. The dose should be obtained regardless of the length of time since the last dose. Immunization is preferred during the 27th to 36th week of gestation. An adult who has not previously received Tdap or who does not know her vaccine status should receive 1 dose of Tdap. This initial dose should be followed by tetanus and diphtheria toxoids (Td) booster doses every 10 years. Adults with an unknown or incomplete history of completing a 3-dose immunization series with Td-containing vaccines should begin or complete a primary immunization series including a Tdap dose. Adults should receive a Td booster every 10 years.  Varicella vaccine. An adult without evidence of immunity to varicella  should receive 2 doses or a second dose if she has previously received 1 dose. Pregnant females who do not have evidence of immunity should receive the first dose after pregnancy. This first dose should be obtained before leaving the health care facility. The second dose should be obtained 4 8 weeks after the first dose.  Human papillomavirus (HPV) vaccine. Females aged 13 26 years who have not received the vaccine previously should obtain the 3-dose series. The vaccine is not recommended for use in pregnant females. However, pregnancy testing is not needed before receiving a dose. If a female is found to be pregnant after receiving a dose, no treatment is needed. In that case, the remaining doses should be delayed until after the pregnancy. Immunization is recommended for any person with an immunocompromised condition through the age of 26 years if she did not get any or all doses earlier. During the 3-dose series, the second dose should be obtained 4 8 weeks after the first dose. The third dose should be obtained 24 weeks after the first dose and 16 weeks after the second dose.  Zoster vaccine. One dose is recommended for adults aged 60 years or older unless certain conditions are present.  Measles, mumps, and rubella (MMR) vaccine. Adults born before 1957 generally are considered immune to measles and mumps. Adults born in 1957 or later should have 1 or more doses of MMR vaccine unless there is a contraindication to the vaccine or there is laboratory evidence of immunity to each of the three diseases. A routine second dose of MMR vaccine should be obtained at least 28 days after the first dose for students attending postsecondary schools, health care workers, or international travelers. People who received inactivated measles vaccine or an unknown type of measles vaccine during 1963 1967 should receive 2 doses of MMR vaccine. People who received inactivated mumps vaccine or an unknown type of mumps vaccine  before 1979 and are at high risk for mumps infection should consider immunization with 2 doses of MMR vaccine. For females of childbearing age, rubella immunity should be determined. If there is no evidence of immunity, females who are not pregnant should be vaccinated. If there   is no evidence of immunity, females who are pregnant should delay immunization until after pregnancy. Unvaccinated health care workers born before 1957 who lack laboratory evidence of measles, mumps, or rubella immunity or laboratory confirmation of disease should consider measles and mumps immunization with 2 doses of MMR vaccine or rubella immunization with 1 dose of MMR vaccine.  Pneumococcal 13-valent conjugate (PCV13) vaccine. When indicated, a person who is uncertain of her immunization history and has no record of immunization should receive the PCV13 vaccine. An adult aged 19 years or older who has certain medical conditions and has not been previously immunized should receive 1 dose of PCV13 vaccine. This PCV13 should be followed with a dose of pneumococcal polysaccharide (PPSV23) vaccine. The PPSV23 vaccine dose should be obtained at least 8 weeks after the dose of PCV13 vaccine. An adult aged 19 years or older who has certain medical conditions and previously received 1 or more doses of PPSV23 vaccine should receive 1 dose of PCV13. The PCV13 vaccine dose should be obtained 1 or more years after the last PPSV23 vaccine dose.  Pneumococcal polysaccharide (PPSV23) vaccine. When PCV13 is also indicated, PCV13 should be obtained first. All adults aged 65 years and older should be immunized. An adult younger than age 65 years who has certain medical conditions should be immunized. Any person who resides in a nursing home or long-term care facility should be immunized. An adult smoker should be immunized. People with an immunocompromised condition and certain other conditions should receive both PCV13 and PPSV23 vaccines. People  with human immunodeficiency virus (HIV) infection should be immunized as soon as possible after diagnosis. Immunization during chemotherapy or radiation therapy should be avoided. Routine use of PPSV23 vaccine is not recommended for American Indians, [place] Natives, or people younger than 65 years unless there are medical conditions that require PPSV23 vaccine. When indicated, people who have unknown immunization and have no record of immunization should receive PPSV23 vaccine. One-time revaccination 5 years after the first dose of PPSV23 is recommended for people aged 19 64 years who have chronic kidney failure, nephrotic syndrome, asplenia, or immunocompromised conditions. People who received 1 2 doses of PPSV23 before age 65 years should receive another dose of PPSV23 vaccine at age 65 years or later if at least 5 years have passed since the previous dose. Doses of PPSV23 are not needed for people immunized with PPSV23 at or after age 65 years.  Meningococcal vaccine. Adults with asplenia or persistent complement component deficiencies should receive 2 doses of quadrivalent meningococcal conjugate (MenACWY-D) vaccine. The doses should be obtained at least 2 months apart. Microbiologists working with certain meningococcal bacteria, military recruits, people at risk during an outbreak, and people who travel to or live in countries with a high rate of meningitis should be immunized. A first-year college student up through age 21 years who is living in a residence hall should receive a dose if she did not receive a dose on or after her 16th birthday. Adults who have certain high-risk conditions should receive one or more doses of vaccine.  Hepatitis A vaccine. Adults who wish to be protected from this disease, have certain high-risk conditions, work with hepatitis A-infected animals, work in hepatitis A research labs, or travel to or work in countries with a high rate of hepatitis A should be immunized. Adults  who were previously unvaccinated and who anticipate close contact with an international adoptee during the first 60 days after arrival in the [place] from a country   with a high rate of hepatitis A should be immunized.  Hepatitis B vaccine. Adults who wish to be protected from this disease, have certain high-risk conditions, may be exposed to blood or other infectious body fluids, are household contacts or sex partners of hepatitis B positive people, are clients or workers in certain care facilities, or travel to or work in countries with a high rate of hepatitis B should be immunized.  Haemophilus influenzae type b (Hib) vaccine. A previously unvaccinated person with asplenia or sickle cell disease or having a scheduled splenectomy should receive 1 dose of Hib vaccine. Regardless of previous immunization, a recipient of a hematopoietic stem cell transplant should receive a 3-dose series 6 12 months after her successful transplant. Hib vaccine is not recommended for adults with HIV infection. Preventive Services / Frequency Ages 19 to 39  Blood pressure check.** / Every 1 to 2 years.  Lipid and cholesterol check.** / Every 5 years beginning at age 20.  Clinical breast exam.** / Every 3 years for women in their 20s and 30s.  BRCA-related cancer risk assessment.** / For women who have family members with a BRCA-related cancer (breast, ovarian, tubal, or peritoneal cancers).  Pap test.** / Every 2 years from ages 21 through 29. Every 3 years starting at age 30 through age 65 or 70 with a history of 3 consecutive normal Pap tests.  HPV screening.** / Every 3 years from ages 30 through ages 65 to 70 with a history of 3 consecutive normal Pap tests.  Hepatitis C blood test.** / For any individual with known risks for hepatitis C.  Skin self-exam. / Monthly.  Influenza vaccine. / Every year.  Tetanus, diphtheria, and acellular pertussis (Tdap, Td) vaccine.** / Consult your caregiver. Pregnant  women should receive 1 dose of Tdap vaccine during each pregnancy. 1 dose of Td every 10 years.  Varicella vaccine.** / Consult your caregiver. Pregnant females who do not have evidence of immunity should receive the first dose after pregnancy.  HPV vaccine. / 3 doses over 6 months, if 26 and younger. The vaccine is not recommended for use in pregnant females. However, pregnancy testing is not needed before receiving a dose.  Measles, mumps, rubella (MMR) vaccine.** / You need at least 1 dose of MMR if you were born in 1957 or later. You may also need a 2nd dose. For females of childbearing age, rubella immunity should be determined. If there is no evidence of immunity, females who are not pregnant should be vaccinated. If there is no evidence of immunity, females who are pregnant should delay immunization until after pregnancy.  Pneumococcal 13-valent conjugate (PCV13) vaccine.** / Consult your caregiver.  Pneumococcal polysaccharide (PPSV23) vaccine.** / 1 to 2 doses if you smoke cigarettes or if you have certain conditions.  Meningococcal vaccine.** / 1 dose if you are age 19 to 21 years and a first-year college student living in a residence hall, or have one of several medical conditions, you need to get vaccinated against meningococcal disease. You may also need additional booster doses.  Hepatitis A vaccine.** / Consult your caregiver.  Hepatitis B vaccine.** / Consult your caregiver.  Haemophilus influenzae type b (Hib) vaccine.** / Consult your caregiver. Ages 40 to 64  Blood pressure check.** / Every 1 to 2 years.  Lipid and cholesterol check.** / Every 5 years beginning at age 20.  Lung cancer screening. / Every year if you are aged 55 80 years and have a 30-pack-year history of smoking and   currently smoke or have quit within the past 15 years. Yearly screening is stopped once you have quit smoking for at least 15 years or develop a health problem that would prevent you from having  lung cancer treatment.  Clinical breast exam.** / Every year after age 40.  BRCA-related cancer risk assessment.** / For women who have family members with a BRCA-related cancer (breast, ovarian, tubal, or peritoneal cancers).  Mammogram.** / Every year beginning at age 40 and continuing for as long as you are in good health. Consult with your caregiver.  Pap test.** / Every 3 years starting at age 30 through age 65 or 70 with a history of 3 consecutive normal Pap tests.  HPV screening.** / Every 3 years from ages 30 through ages 65 to 70 with a history of 3 consecutive normal Pap tests.  Fecal occult blood test (FOBT) of stool. / Every year beginning at age 50 and continuing until age 75. You may not need to do this test if you get a colonoscopy every 10 years.  Flexible sigmoidoscopy or colonoscopy.** / Every 5 years for a flexible sigmoidoscopy or every 10 years for a colonoscopy beginning at age 50 and continuing until age 75.  Hepatitis C blood test.** / For all people born from 1945 through 1965 and any individual with known risks for hepatitis C.  Skin self-exam. / Monthly.  Influenza vaccine. / Every year.  Tetanus, diphtheria, and acellular pertussis (Tdap/Td) vaccine.** / Consult your caregiver. Pregnant women should receive 1 dose of Tdap vaccine during each pregnancy. 1 dose of Td every 10 years.  Varicella vaccine.** / Consult your caregiver. Pregnant females who do not have evidence of immunity should receive the first dose after pregnancy.  Zoster vaccine.** / 1 dose for adults aged 60 years or older.  Measles, mumps, rubella (MMR) vaccine.** / You need at least 1 dose of MMR if you were born in 1957 or later. You may also need a 2nd dose. For females of childbearing age, rubella immunity should be determined. If there is no evidence of immunity, females who are not pregnant should be vaccinated. If there is no evidence of immunity, females who are pregnant should delay  immunization until after pregnancy.  Pneumococcal 13-valent conjugate (PCV13) vaccine.** / Consult your caregiver.  Pneumococcal polysaccharide (PPSV23) vaccine.** / 1 to 2 doses if you smoke cigarettes or if you have certain conditions.  Meningococcal vaccine.** / Consult your caregiver.  Hepatitis A vaccine.** / Consult your caregiver.  Hepatitis B vaccine.** / Consult your caregiver.  Haemophilus influenzae type b (Hib) vaccine.** / Consult your caregiver. Ages 65 and over  Blood pressure check.** / Every 1 to 2 years.  Lipid and cholesterol check.** / Every 5 years beginning at age 20.  Lung cancer screening. / Every year if you are aged 55 80 years and have a 30-pack-year history of smoking and currently smoke or have quit within the past 15 years. Yearly screening is stopped once you have quit smoking for at least 15 years or develop a health problem that would prevent you from having lung cancer treatment.  Clinical breast exam.** / Every year after age 40.  BRCA-related cancer risk assessment.** / For women who have family members with a BRCA-related cancer (breast, ovarian, tubal, or peritoneal cancers).  Mammogram.** / Every year beginning at age 40 and continuing for as long as you are in good health. Consult with your caregiver.  Pap test.** / Every 3 years starting at age   30 through age 65 or 70 with a 3 consecutive normal Pap tests. Testing can be stopped between 65 and 70 with 3 consecutive normal Pap tests and no abnormal Pap or HPV tests in the past 10 years.  HPV screening.** / Every 3 years from ages 30 through ages 65 or 70 with a history of 3 consecutive normal Pap tests. Testing can be stopped between 65 and 70 with 3 consecutive normal Pap tests and no abnormal Pap or HPV tests in the past 10 years.  Fecal occult blood test (FOBT) of stool. / Every year beginning at age 50 and continuing until age 75. You may not need to do this test if you get a colonoscopy  every 10 years.  Flexible sigmoidoscopy or colonoscopy.** / Every 5 years for a flexible sigmoidoscopy or every 10 years for a colonoscopy beginning at age 50 and continuing until age 75.  Hepatitis C blood test.** / For all people born from 1945 through 1965 and any individual with known risks for hepatitis C.  Osteoporosis screening.** / A one-time screening for women ages 65 and over and women at risk for fractures or osteoporosis.  Skin self-exam. / Monthly.  Influenza vaccine. / Every year.  Tetanus, diphtheria, and acellular pertussis (Tdap/Td) vaccine.** / 1 dose of Td every 10 years.  Varicella vaccine.** / Consult your caregiver.  Zoster vaccine.** / 1 dose for adults aged 60 years or older.  Pneumococcal 13-valent conjugate (PCV13) vaccine.** / Consult your caregiver.  Pneumococcal polysaccharide (PPSV23) vaccine.** / 1 dose for all adults aged 65 years and older.  Meningococcal vaccine.** / Consult your caregiver.  Hepatitis A vaccine.** / Consult your caregiver.  Hepatitis B vaccine.** / Consult your caregiver.  Haemophilus influenzae type b (Hib) vaccine.** / Consult your caregiver. ** Family history and personal history of risk and conditions may change your caregiver's recommendations. Document Released: 01/29/2002 Document Revised: 03/30/2013 Document Reviewed: 04/30/2011 [employer] Patient Information 2014 [employer].  

## 2013-10-29 NOTE — Progress Notes (Signed)
Pre visit review using our clinic review tool, if applicable. No additional management support is needed unless otherwise documented below in the visit note. 

## 2013-10-29 NOTE — Progress Notes (Signed)
  Subjective:    Patient ID: Erin Mays, female    DOB: 10-Oct-1981, 32 y.o.   MRN: [id number]  HPI  She returns for a physical - she tells me that she feels well and offers no complaints.   Review of Systems  Constitutional: Negative.   HENT: Negative.   Eyes: Negative.   Respiratory: Negative.  Negative for apnea, cough, choking, chest tightness, shortness of breath, wheezing and stridor.   Cardiovascular: Negative.  Negative for chest pain, palpitations and leg swelling.  Gastrointestinal: Negative.  Negative for nausea, vomiting, abdominal pain, diarrhea, constipation and blood in stool.  Endocrine: Negative.   Genitourinary: Negative.   Musculoskeletal: Negative.   Skin: Negative.   Allergic/Immunologic: Negative.   Neurological: Negative.   Hematological: Negative.  Negative for adenopathy. Does not bruise/bleed easily.  Psychiatric/Behavioral: Positive for sleep disturbance. Negative for suicidal ideas, hallucinations, behavioral problems, confusion, self-injury, dysphoric mood, decreased concentration and agitation. The patient is nervous/anxious. The patient is not hyperactive.        Objective:   Physical Exam  Vitals reviewed. Constitutional: She is oriented to person, place, and time. She appears well-developed and well-nourished. No distress.  HENT:  Head: Normocephalic and atraumatic.  Mouth/Throat: Oropharynx is clear and moist. No oropharyngeal exudate.  Eyes: Conjunctivae are normal. Right eye exhibits no discharge. Left eye exhibits no discharge. No scleral icterus.  Neck: Normal range of motion. Neck supple. No JVD present. No tracheal deviation present. No thyromegaly present.  Cardiovascular: Normal rate, regular rhythm, normal heart sounds and intact distal pulses.  Exam reveals no gallop and no friction rub.   No murmur heard. Pulmonary/Chest: Effort normal and breath sounds normal. No stridor. No respiratory distress. She has no wheezes. She has no  rales. She exhibits no tenderness.  Abdominal: Soft. Bowel sounds are normal. She exhibits no distension and no mass. There is no tenderness. There is no rebound and no guarding.  Musculoskeletal: Normal range of motion. She exhibits no edema and no tenderness.  Lymphadenopathy:    She has no cervical adenopathy.  Neurological: She is oriented to person, place, and time.  Skin: Skin is warm and dry. No rash noted. She is not diaphoretic. No erythema. No pallor.  Psychiatric: She has a normal mood and affect. Her behavior is normal. Judgment and thought content normal.     Lab Results  Component Value Date   WBC 5.7 09/20/2011   HGB 13.6 09/20/2011   HCT 40.6 09/20/2011   PLT 305.0 09/20/2011   GLUCOSE 87 09/20/2011   CHOL 225* 09/20/2011   TRIG 114.0 09/20/2011   HDL 74.80 09/20/2011   LDLDIRECT 145.0 09/20/2011   LDLCALC 111* 08/18/2010   ALT 15 09/20/2011   AST 21 09/20/2011   NA 140 09/20/2011   K 4.3 09/20/2011   CL 107 09/20/2011   CREATININE 0.8 09/20/2011   BUN 17 09/20/2011   CO2 27 09/20/2011   TSH 1.01 09/20/2011       Assessment & Plan:

## 2013-10-29 NOTE — Assessment & Plan Note (Signed)
She will cont klonopin, wellbutrin, and zoloft

## 2013-10-29 NOTE — Assessment & Plan Note (Signed)
Exam done Labs ordered Vaccines were updated Pt ed material was given 

## 2013-11-25 ENCOUNTER — Other Ambulatory Visit: Payer: Self-pay | Admitting: Internal Medicine

## 2013-11-25 MED ORDER — BUPROPION HCL 75 MG PO TABS: 75.0000 mg | ORAL_TABLET | Freq: Every day | ORAL | Status: DC

## 2014-02-18 LAB — HM PAP SMEAR: HM Pap smear: NORMAL

## 2014-05-07 ENCOUNTER — Other Ambulatory Visit: Payer: Self-pay | Admitting: Internal Medicine

## 2014-05-22 LAB — LIPID PANEL
Cholesterol: 249 mg/dL — AB (ref 0–200)
HDL: 78 mg/dL — AB (ref 35–70)
LDL Cholesterol: 148 mg/dL
TRIGLYCERIDES: 116 mg/dL (ref 40–160)

## 2014-05-22 LAB — BASIC METABOLIC PANEL
BUN: 15 mg/dL (ref 4–21)
CREATININE: 0.8 mg/dL (ref 0.5–1.1)
Glucose: 82 mg/dL
POTASSIUM: 4.6 mmol/L (ref 3.4–5.3)
SODIUM: 142 mmol/L (ref 137–147)

## 2014-05-22 LAB — CBC AND DIFFERENTIAL
HCT: 41 % (ref 36–46)
Hemoglobin: 13.7 g/dL (ref 12.0–16.0)
Platelets: 329 10*3/uL (ref 150–399)
WBC: 5.8 10^3/mL

## 2014-05-22 LAB — TSH: TSH: 1.03 u[IU]/mL (ref 0.41–5.90)

## 2014-05-22 LAB — HEPATIC FUNCTION PANEL
ALT: 18 U/L (ref 7–35)
AST: 20 U/L (ref 13–35)
Alkaline Phosphatase: 81 U/L (ref 25–125)
Bilirubin, Total: 0.4 mg/dL

## 2014-06-17 ENCOUNTER — Ambulatory Visit (INDEPENDENT_AMBULATORY_CARE_PROVIDER_SITE_OTHER): Payer: BC Managed Care – PPO | Admitting: Internal Medicine

## 2014-06-17 ENCOUNTER — Encounter: Payer: Self-pay | Admitting: Internal Medicine

## 2014-06-17 VITALS — BP 118/82 | HR 76 | Temp 98.2°F | Resp 16 | Ht 66.0 in | Wt 142.0 lb

## 2014-06-17 DIAGNOSIS — J309 Allergic rhinitis, unspecified: Secondary | ICD-10-CM

## 2014-06-17 DIAGNOSIS — F329 Major depressive disorder, single episode, unspecified: Secondary | ICD-10-CM

## 2014-06-17 DIAGNOSIS — G4700 Insomnia, unspecified: Secondary | ICD-10-CM

## 2014-06-17 DIAGNOSIS — F411 Generalized anxiety disorder: Secondary | ICD-10-CM

## 2014-06-17 DIAGNOSIS — E785 Hyperlipidemia, unspecified: Secondary | ICD-10-CM

## 2014-06-17 DIAGNOSIS — F3289 Other specified depressive episodes: Secondary | ICD-10-CM

## 2014-06-17 MED ORDER — CLONAZEPAM 1 MG PO TABS: 1.0000 mg | ORAL_TABLET | Freq: Three times a day (TID) | ORAL | Status: DC | PRN

## 2014-06-17 NOTE — Progress Notes (Signed)
Pre visit review using our clinic review tool, if applicable. No additional management support is needed unless otherwise documented below in the visit note. 

## 2014-06-17 NOTE — Progress Notes (Signed)
   Subjective:    Patient ID: Erin Mays, female    DOB: [birth year], 33 y.o.   MRN: [id number]  HPI Comments: She returns for f/up on anxiety and she tells me that she feels well and offers no complaints, she takes the klonopin about one time per day with no escalation in her use of this.     Review of Systems  Constitutional: Negative.  Negative for fever, chills, diaphoresis, appetite change and fatigue.  HENT: Negative.   Eyes: Negative.   Respiratory: Negative.  Negative for cough, choking, chest tightness, shortness of breath and stridor.   Cardiovascular: Negative.  Negative for chest pain, palpitations and leg swelling.  Gastrointestinal: Negative.  Negative for nausea, vomiting, abdominal pain, diarrhea, constipation and blood in stool.  Endocrine: Negative.   Genitourinary: Negative.   Musculoskeletal: Negative.   Skin: Negative.   Allergic/Immunologic: Negative.   Neurological: Negative.  Negative for dizziness.  Hematological: Negative.  Negative for adenopathy. Does not bruise/bleed easily.  Psychiatric/Behavioral: Positive for sleep disturbance. Negative for suicidal ideas, hallucinations, behavioral problems, confusion, self-injury, dysphoric mood, decreased concentration and agitation. The patient is nervous/anxious. The patient is not hyperactive.        Objective:   Physical Exam  Vitals reviewed. Constitutional: She is oriented to person, place, and time. She appears well-developed and well-nourished. No distress.  HENT:  Head: Normocephalic and atraumatic.  Mouth/Throat: Oropharynx is clear and moist. No oropharyngeal exudate.  Eyes: Conjunctivae are normal. Right eye exhibits no discharge. Left eye exhibits no discharge. No scleral icterus.  Neck: Normal range of motion. Neck supple. No JVD present. No tracheal deviation present. No thyromegaly present.  Cardiovascular: Normal rate, regular rhythm, normal heart sounds and intact distal pulses.  Exam  reveals no gallop and no friction rub.   No murmur heard. Pulmonary/Chest: Effort normal and breath sounds normal. No stridor. No respiratory distress. She has no wheezes. She has no rales. She exhibits no tenderness.  Abdominal: Soft. Bowel sounds are normal. She exhibits no distension and no mass. There is no tenderness. There is no rebound and no guarding.  Musculoskeletal: Normal range of motion. She exhibits no edema and no tenderness.  Lymphadenopathy:    She has no cervical adenopathy.  Neurological: She is oriented to person, place, and time.  Skin: Skin is warm and dry. No rash noted. She is not diaphoretic. No erythema. No pallor.  Psychiatric: She has a normal mood and affect. Her behavior is normal. Judgment and thought content normal.     Lab Results  Component Value Date   WBC 6.4 10/29/2013   HGB 13.6 10/29/2013   HCT 39.5 10/29/2013   PLT 346.0 10/29/2013   GLUCOSE 91 10/29/2013   CHOL 228* 10/29/2013   TRIG 123.0 10/29/2013   HDL 73.50 10/29/2013   LDLDIRECT 147.9 10/29/2013   LDLCALC 111* 08/18/2010   ALT 12 10/29/2013   AST 18 10/29/2013   NA 137 10/29/2013   K 4.3 10/29/2013   CL 106 10/29/2013   CREATININE 0.8 10/29/2013   BUN 13 10/29/2013   CO2 24 10/29/2013   TSH 1.54 10/29/2013       Assessment & Plan:

## 2014-06-17 NOTE — Patient Instructions (Signed)

## 2014-06-19 NOTE — Assessment & Plan Note (Signed)
She is not willing to take an SSRI Will cont klonopin as needed I will check her UDS today to confirm compliance and screen for substance abuse

## 2014-07-05 ENCOUNTER — Encounter: Payer: Self-pay | Admitting: Internal Medicine

## 2014-07-27 ENCOUNTER — Telehealth: Payer: Self-pay | Admitting: *Deleted

## 2014-07-27 NOTE — Telephone Encounter (Signed)
Left msg on triage stating taking wellbrutrin & sertraline, but she thinks the wellbrutrin ia making her more irritable. Wanting to see can md increase or change to something else. Called pt back inform her she will need to make f/u appt to discuss sxs before md will change. Pt stated she will have to call back to make appt getting ready to go into a meeting...Erin Mays/lmb

## 2014-12-21 ENCOUNTER — Other Ambulatory Visit: Payer: Self-pay | Admitting: *Deleted

## 2014-12-21 ENCOUNTER — Other Ambulatory Visit: Payer: Self-pay | Admitting: Internal Medicine

## 2014-12-21 MED ORDER — CLONAZEPAM 1 MG PO TABS: 1.0000 mg | ORAL_TABLET | Freq: Three times a day (TID) | ORAL | Status: DC | PRN

## 2014-12-21 NOTE — Addendum Note (Signed)
Addended by: Etta GrandchildJONES, Margaruite Top L on: 12/21/2014 07:43 AM   Modules accepted: Orders

## 2014-12-21 NOTE — Telephone Encounter (Signed)
See previous request. MD closed. Rx faxed to [facility].

## 2015-01-10 ENCOUNTER — Ambulatory Visit: Payer: Self-pay | Admitting: Internal Medicine

## 2015-01-26 ENCOUNTER — Telehealth: Payer: Self-pay | Admitting: Internal Medicine

## 2015-01-26 NOTE — Telephone Encounter (Signed)
Will refill at scheduled appt

## 2015-01-26 NOTE — Telephone Encounter (Signed)
Called pt to rec appt for tomorrow and she wanted to know if Dr Yetta Barrejones could refill her [facility](WELLBUTRIN) 75 MG tablet [[id number]][[id number]] ?

## 2015-01-27 ENCOUNTER — Encounter: Payer: Self-pay | Admitting: Internal Medicine

## 2015-02-07 ENCOUNTER — Encounter: Payer: Self-pay | Admitting: Internal Medicine

## 2015-02-07 ENCOUNTER — Ambulatory Visit (INDEPENDENT_AMBULATORY_CARE_PROVIDER_SITE_OTHER): Payer: BLUE CROSS/BLUE SHIELD | Admitting: Internal Medicine

## 2015-02-07 VITALS — BP 120/76 | HR 88 | Temp 99.4°F | Resp 16 | Ht 66.0 in | Wt 143.1 lb

## 2015-02-07 DIAGNOSIS — F411 Generalized anxiety disorder: Secondary | ICD-10-CM

## 2015-02-07 DIAGNOSIS — F5105 Insomnia due to other mental disorder: Secondary | ICD-10-CM

## 2015-02-07 DIAGNOSIS — F409 Phobic anxiety disorder, unspecified: Secondary | ICD-10-CM

## 2015-02-07 DIAGNOSIS — Z0000 Encounter for general adult medical examination without abnormal findings: Secondary | ICD-10-CM

## 2015-02-07 MED ORDER — ALPRAZOLAM 0.25 MG PO TABS: 0.2500 mg | ORAL_TABLET | Freq: Two times a day (BID) | ORAL | Status: DC | PRN

## 2015-02-07 MED ORDER — SERTRALINE HCL 100 MG PO TABS: 100.0000 mg | ORAL_TABLET | Freq: Every day | ORAL | Status: DC

## 2015-02-07 NOTE — Assessment & Plan Note (Signed)
She refused vaccines and labs today She sees her GYN later this week Exam done Prior labs were reviewed Pt ed material was given

## 2015-02-07 NOTE — Progress Notes (Signed)
Subjective:    Patient ID: Erin Mays, female    DOB: July 21, 1981, 34 y.o.   MRN: [id number]  HPI Comments: She has separated from her BF and feels more anxious than usual - she requests a short term Rx for xanax as she feels like this controls anxiety better then klonopin, the klonopin is better for sleep for her.     Review of Systems  Constitutional: Negative.  Negative for fever, chills, diaphoresis, appetite change and fatigue.  HENT: Negative.   Eyes: Negative.   Respiratory: Negative.  Negative for cough, choking, chest tightness, shortness of breath and stridor.   Cardiovascular: Negative.  Negative for chest pain, palpitations and leg swelling.  Gastrointestinal: Negative.  Negative for nausea, vomiting, abdominal pain, diarrhea, constipation and blood in stool.  Endocrine: Negative.   Genitourinary: Negative.   Musculoskeletal: Negative.  Negative for myalgias, back pain, joint swelling and arthralgias.  Skin: Negative.   Allergic/Immunologic: Negative.   Neurological: Negative.   Hematological: Negative.  Negative for adenopathy. Does not bruise/bleed easily.  Psychiatric/Behavioral: Positive for sleep disturbance and dysphoric mood. Negative for suicidal ideas, hallucinations, behavioral problems, confusion, self-injury, decreased concentration and agitation. The patient is nervous/anxious. The patient is not hyperactive.        Objective:   Physical Exam  Constitutional: She is oriented to person, place, and time. She appears well-developed and well-nourished. No distress.  HENT:  Head: Normocephalic and atraumatic.  Mouth/Throat: Oropharynx is clear and moist. No oropharyngeal exudate.  Eyes: Conjunctivae are normal. Right eye exhibits no discharge. Left eye exhibits no discharge. No scleral icterus.  Neck: Normal range of motion. Neck supple. No JVD present. No tracheal deviation present. No thyromegaly present.  Cardiovascular: Normal rate, regular rhythm,  normal heart sounds and intact distal pulses.  Exam reveals no gallop and no friction rub.   No murmur heard. Pulmonary/Chest: Effort normal and breath sounds normal. No stridor. No respiratory distress. She has no wheezes. She has no rales. She exhibits no tenderness.  Abdominal: Soft. Bowel sounds are normal. She exhibits no distension and no mass. There is no tenderness. There is no rebound and no guarding.  Musculoskeletal: Normal range of motion. She exhibits no edema or tenderness.  Lymphadenopathy:    She has no cervical adenopathy.  Neurological: She is oriented to person, place, and time.  Skin: Skin is warm and dry. No rash noted. She is not diaphoretic. No erythema. No pallor.  Psychiatric: Her behavior is normal. Judgment and thought content normal. Her mood appears anxious. Her affect is not angry, not blunt, not labile and not inappropriate. Her speech is not rapid and/or pressured, not delayed, not tangential and not slurred. Cognition and memory are normal. She does not exhibit a depressed mood. She expresses no homicidal and no suicidal ideation. She expresses no suicidal plans and no homicidal plans. She is communicative.  Vitals reviewed.   Lab Results  Component Value Date   WBC 5.8 05/22/2014   HGB 13.7 05/22/2014   HCT 41 05/22/2014   PLT 329 05/22/2014   GLUCOSE 91 10/29/2013   CHOL 249* 05/22/2014   TRIG 116 05/22/2014   HDL 78* 05/22/2014   LDLDIRECT 147.9 10/29/2013   LDLCALC 148 05/22/2014   ALT 18 05/22/2014   AST 20 05/22/2014   NA 142 05/22/2014   K 4.6 05/22/2014   CL 106 10/29/2013   CREATININE 0.8 05/22/2014   BUN 15 05/22/2014   CO2 24 10/29/2013   TSH 1.03 05/22/2014  Assessment & Plan:

## 2015-02-07 NOTE — Progress Notes (Signed)
Pre visit review using our clinic review tool, if applicable. No additional management support is needed unless otherwise documented below in the visit note. 

## 2015-02-07 NOTE — Assessment & Plan Note (Signed)
Will cont the current meds and will add xanax (short term) at her request

## 2015-02-07 NOTE — Patient Instructions (Signed)
Preventive Care for Adults A healthy lifestyle and preventive care can promote health and wellness. Preventive health guidelines for women include the following key practices.  A routine yearly physical is a good way to check with your health care provider about your health and preventive screening. It is a chance to share any concerns and updates on your health and to receive a thorough exam.  Visit your dentist for a routine exam and preventive care every 6 months. Brush your teeth twice a day and floss once a day. Good oral hygiene prevents tooth decay and gum disease.  The frequency of eye exams is based on your age, health, family medical history, use of contact lenses, and other factors. Follow your health care provider's recommendations for frequency of eye exams.  Eat a healthy diet. Foods like vegetables, fruits, whole grains, low-fat dairy products, and lean protein foods contain the nutrients you need without too many calories. Decrease your intake of foods high in solid fats, added sugars, and salt. Eat the right amount of calories for you.Get information about a proper diet from your health care provider, if necessary.  Regular physical exercise is one of the most important things you can do for your health. Most adults should get at least 150 minutes of moderate-intensity exercise (any activity that increases your heart rate and causes you to sweat) each week. In addition, most adults need muscle-strengthening exercises on 2 or more days a week.  Maintain a healthy weight. The body mass index (BMI) is a screening tool to identify possible weight problems. It provides an estimate of body fat based on height and weight. Your health care provider can find your BMI and can help you achieve or maintain a healthy weight.For adults 20 years and older:  A BMI below 18.5 is considered underweight.  A BMI of 18.5 to 24.9 is normal.  A BMI of 25 to 29.9 is considered overweight.  A BMI of  30 and above is considered obese.  Maintain normal blood lipids and cholesterol levels by exercising and minimizing your intake of saturated fat. Eat a balanced diet with plenty of fruit and vegetables. Blood tests for lipids and cholesterol should begin at age 76 and be repeated every 5 years. If your lipid or cholesterol levels are high, you are over 50, or you are at high risk for heart disease, you may need your cholesterol levels checked more frequently.Ongoing high lipid and cholesterol levels should be treated with medicines if diet and exercise are not working.  If you smoke, find out from your health care provider how to quit. If you do not use tobacco, do not start.  Lung cancer screening is recommended for adults aged 22-80 years who are at high risk for developing lung cancer because of a history of smoking. A yearly low-dose CT scan of the lungs is recommended for people who have at least a 30-pack-year history of smoking and are a current smoker or have quit within the past 15 years. A pack year of smoking is smoking an average of 1 pack of cigarettes a day for 1 year (for example: 1 pack a day for 30 years or 2 packs a day for 15 years). Yearly screening should continue until the smoker has stopped smoking for at least 15 years. Yearly screening should be stopped for people who develop a health problem that would prevent them from having lung cancer treatment.  If you are pregnant, do not drink alcohol. If you are breastfeeding,  be very cautious about drinking alcohol. If you are not pregnant and choose to drink alcohol, do not have more than 1 drink per day. One drink is considered to be 12 ounces (355 mL) of beer, 5 ounces (148 mL) of wine, or 1.5 ounces (44 mL) of liquor.  Avoid use of street drugs. Do not share needles with anyone. Ask for help if you need support or instructions about stopping the use of drugs.  High blood pressure causes heart disease and increases the risk of  stroke. Your blood pressure should be checked at least every 1 to 2 years. Ongoing high blood pressure should be treated with medicines if weight loss and exercise do not work.  If you are 75-52 years old, ask your health care provider if you should take aspirin to prevent strokes.  Diabetes screening involves taking a blood sample to check your fasting blood sugar level. This should be done once every 3 years, after age 15, if you are within normal weight and without risk factors for diabetes. Testing should be considered at a younger age or be carried out more frequently if you are overweight and have at least 1 risk factor for diabetes.  Breast cancer screening is essential preventive care for women. You should practice "breast self-awareness." This means understanding the normal appearance and feel of your breasts and may include breast self-examination. Any changes detected, no matter how small, should be reported to a health care provider. Women in their 58s and 30s should have a clinical breast exam (CBE) by a health care provider as part of a regular health exam every 1 to 3 years. After age 16, women should have a CBE every year. Starting at age 53, women should consider having a mammogram (breast X-ray test) every year. Women who have a family history of breast cancer should talk to their health care provider about genetic screening. Women at a high risk of breast cancer should talk to their health care providers about having an MRI and a mammogram every year.  Breast cancer gene (BRCA)-related cancer risk assessment is recommended for women who have family members with BRCA-related cancers. BRCA-related cancers include breast, ovarian, tubal, and peritoneal cancers. Having family members with these cancers may be associated with an increased risk for harmful changes (mutations) in the breast cancer genes BRCA1 and BRCA2. Results of the assessment will determine the need for genetic counseling and  BRCA1 and BRCA2 testing.  Routine pelvic exams to screen for cancer are no longer recommended for nonpregnant women who are considered low risk for cancer of the pelvic organs (ovaries, uterus, and vagina) and who do not have symptoms. Ask your health care provider if a screening pelvic exam is right for you.  If you have had past treatment for cervical cancer or a condition that could lead to cancer, you need Pap tests and screening for cancer for at least 20 years after your treatment. If Pap tests have been discontinued, your risk factors (such as having a new sexual partner) need to be reassessed to determine if screening should be resumed. Some women have medical problems that increase the chance of getting cervical cancer. In these cases, your health care provider may recommend more frequent screening and Pap tests.  The HPV test is an additional test that may be used for cervical cancer screening. The HPV test looks for the virus that can cause the cell changes on the cervix. The cells collected during the Pap test can be  tested for HPV. The HPV test could be used to screen women aged 30 years and older, and should be used in women of any age who have unclear Pap test results. After the age of 30, women should have HPV testing at the same frequency as a Pap test.  Colorectal cancer can be detected and often prevented. Most routine colorectal cancer screening begins at the age of 50 years and continues through age 75 years. However, your health care provider may recommend screening at an earlier age if you have risk factors for colon cancer. On a yearly basis, your health care provider may provide home test kits to check for hidden blood in the stool. Use of a small camera at the end of a tube, to directly examine the colon (sigmoidoscopy or colonoscopy), can detect the earliest forms of colorectal cancer. Talk to your health care provider about this at age 50, when routine screening begins. Direct  exam of the colon should be repeated every 5-10 years through age 75 years, unless early forms of pre-cancerous polyps or small growths are found.  People who are at an increased risk for hepatitis B should be screened for this virus. You are considered at high risk for hepatitis B if:  You were born in a country where hepatitis B occurs often. Talk with your health care provider about which countries are considered high risk.  Your parents were born in a high-risk country and you have not received a shot to protect against hepatitis B (hepatitis B vaccine).  You have HIV or AIDS.  You use needles to inject street drugs.  You live with, or have sex with, someone who has hepatitis B.  You get hemodialysis treatment.  You take certain medicines for conditions like cancer, organ transplantation, and autoimmune conditions.  Hepatitis C blood testing is recommended for all people born from 1945 through 1965 and any individual with known risks for hepatitis C.  Practice safe sex. Use condoms and avoid high-risk sexual practices to reduce the spread of sexually transmitted infections (STIs). STIs include gonorrhea, chlamydia, syphilis, trichomonas, herpes, HPV, and human immunodeficiency virus (HIV). Herpes, HIV, and HPV are viral illnesses that have no cure. They can result in disability, cancer, and death.  You should be screened for sexually transmitted illnesses (STIs) including gonorrhea and chlamydia if:  You are sexually active and are younger than 24 years.  You are older than 24 years and your health care provider tells you that you are at risk for this type of infection.  Your sexual activity has changed since you were last screened and you are at an increased risk for chlamydia or gonorrhea. Ask your health care provider if you are at risk.  If you are at risk of being infected with HIV, it is recommended that you take a prescription medicine daily to prevent HIV infection. This is  called preexposure prophylaxis (PrEP). You are considered at risk if:  You are a heterosexual woman, are sexually active, and are at increased risk for HIV infection.  You take drugs by injection.  You are sexually active with a partner who has HIV.  Talk with your health care provider about whether you are at high risk of being infected with HIV. If you choose to begin PrEP, you should first be tested for HIV. You should then be tested every 3 months for as long as you are taking PrEP.  Osteoporosis is a disease in which the bones lose minerals and strength   with aging. This can result in serious bone fractures or breaks. The risk of osteoporosis can be identified using a bone density scan. Women ages 65 years and over and women at risk for fractures or osteoporosis should discuss screening with their health care providers. Ask your health care provider whether you should take a calcium supplement or vitamin D to reduce the rate of osteoporosis.  Menopause can be associated with physical symptoms and risks. Hormone replacement therapy is available to decrease symptoms and risks. You should talk to your health care provider about whether hormone replacement therapy is right for you.  Use sunscreen. Apply sunscreen liberally and repeatedly throughout the day. You should seek shade when your shadow is shorter than you. Protect yourself by wearing long sleeves, pants, a wide-brimmed hat, and sunglasses year round, whenever you are outdoors.  Once a month, do a whole body skin exam, using a mirror to look at the skin on your back. Tell your health care provider of new moles, moles that have irregular borders, moles that are larger than a pencil eraser, or moles that have changed in shape or color.  Stay current with required vaccines (immunizations).  Influenza vaccine. All adults should be immunized every year.  Tetanus, diphtheria, and acellular pertussis (Td, Tdap) vaccine. Pregnant women should  receive 1 dose of Tdap vaccine during each pregnancy. The dose should be obtained regardless of the length of time since the last dose. Immunization is preferred during the 27th-36th week of gestation. An adult who has not previously received Tdap or who does not know her vaccine status should receive 1 dose of Tdap. This initial dose should be followed by tetanus and diphtheria toxoids (Td) booster doses every 10 years. Adults with an unknown or incomplete history of completing a 3-dose immunization series with Td-containing vaccines should begin or complete a primary immunization series including a Tdap dose. Adults should receive a Td booster every 10 years.  Varicella vaccine. An adult without evidence of immunity to varicella should receive 2 doses or a second dose if she has previously received 1 dose. Pregnant females who do not have evidence of immunity should receive the first dose after pregnancy. This first dose should be obtained before leaving the health care facility. The second dose should be obtained 4-8 weeks after the first dose.  Human papillomavirus (HPV) vaccine. Females aged 13-26 years who have not received the vaccine previously should obtain the 3-dose series. The vaccine is not recommended for use in pregnant females. However, pregnancy testing is not needed before receiving a dose. If a female is found to be pregnant after receiving a dose, no treatment is needed. In that case, the remaining doses should be delayed until after the pregnancy. Immunization is recommended for any person with an immunocompromised condition through the age of 26 years if she did not get any or all doses earlier. During the 3-dose series, the second dose should be obtained 4-8 weeks after the first dose. The third dose should be obtained 24 weeks after the first dose and 16 weeks after the second dose.  Zoster vaccine. One dose is recommended for adults aged 60 years or older unless certain conditions are  present.  Measles, mumps, and rubella (MMR) vaccine. Adults born before 1957 generally are considered immune to measles and mumps. Adults born in 1957 or later should have 1 or more doses of MMR vaccine unless there is a contraindication to the vaccine or there is laboratory evidence of immunity to   each of the three diseases. A routine second dose of MMR vaccine should be obtained at least 28 days after the first dose for students attending postsecondary schools, health care workers, or international travelers. People who received inactivated measles vaccine or an unknown type of measles vaccine during 1963-1967 should receive 2 doses of MMR vaccine. People who received inactivated mumps vaccine or an unknown type of mumps vaccine before 1979 and are at high risk for mumps infection should consider immunization with 2 doses of MMR vaccine. For females of childbearing age, rubella immunity should be determined. If there is no evidence of immunity, females who are not pregnant should be vaccinated. If there is no evidence of immunity, females who are pregnant should delay immunization until after pregnancy. Unvaccinated health care workers born before 1957 who lack laboratory evidence of measles, mumps, or rubella immunity or laboratory confirmation of disease should consider measles and mumps immunization with 2 doses of MMR vaccine or rubella immunization with 1 dose of MMR vaccine.  Pneumococcal 13-valent conjugate (PCV13) vaccine. When indicated, a person who is uncertain of her immunization history and has no record of immunization should receive the PCV13 vaccine. An adult aged 19 years or older who has certain medical conditions and has not been previously immunized should receive 1 dose of PCV13 vaccine. This PCV13 should be followed with a dose of pneumococcal polysaccharide (PPSV23) vaccine. The PPSV23 vaccine dose should be obtained at least 8 weeks after the dose of PCV13 vaccine. An adult aged 19  years or older who has certain medical conditions and previously received 1 or more doses of PPSV23 vaccine should receive 1 dose of PCV13. The PCV13 vaccine dose should be obtained 1 or more years after the last PPSV23 vaccine dose.  Pneumococcal polysaccharide (PPSV23) vaccine. When PCV13 is also indicated, PCV13 should be obtained first. All adults aged 65 years and older should be immunized. An adult younger than age 65 years who has certain medical conditions should be immunized. Any person who resides in a nursing home or long-term care facility should be immunized. An adult smoker should be immunized. People with an immunocompromised condition and certain other conditions should receive both PCV13 and PPSV23 vaccines. People with human immunodeficiency virus (HIV) infection should be immunized as soon as possible after diagnosis. Immunization during chemotherapy or radiation therapy should be avoided. Routine use of PPSV23 vaccine is not recommended for American Indians, [place] Natives, or people younger than 65 years unless there are medical conditions that require PPSV23 vaccine. When indicated, people who have unknown immunization and have no record of immunization should receive PPSV23 vaccine. One-time revaccination 5 years after the first dose of PPSV23 is recommended for people aged 19-64 years who have chronic kidney failure, nephrotic syndrome, asplenia, or immunocompromised conditions. People who received 1-2 doses of PPSV23 before age 65 years should receive another dose of PPSV23 vaccine at age 65 years or later if at least 5 years have passed since the previous dose. Doses of PPSV23 are not needed for people immunized with PPSV23 at or after age 65 years.  Meningococcal vaccine. Adults with asplenia or persistent complement component deficiencies should receive 2 doses of quadrivalent meningococcal conjugate (MenACWY-D) vaccine. The doses should be obtained at least 2 months apart.  Microbiologists working with certain meningococcal bacteria, military recruits, people at risk during an outbreak, and people who travel to or live in countries with a high rate of meningitis should be immunized. A first-year college student up through age   21 years who is living in a residence hall should receive a dose if she did not receive a dose on or after her 16th birthday. Adults who have certain high-risk conditions should receive one or more doses of vaccine.  Hepatitis A vaccine. Adults who wish to be protected from this disease, have certain high-risk conditions, work with hepatitis A-infected animals, work in hepatitis A research labs, or travel to or work in countries with a high rate of hepatitis A should be immunized. Adults who were previously unvaccinated and who anticipate close contact with an international adoptee during the first 60 days after arrival in the [place] from a country with a high rate of hepatitis A should be immunized.  Hepatitis B vaccine. Adults who wish to be protected from this disease, have certain high-risk conditions, may be exposed to blood or other infectious body fluids, are household contacts or sex partners of hepatitis B positive people, are clients or workers in certain care facilities, or travel to or work in countries with a high rate of hepatitis B should be immunized.  Haemophilus influenzae type b (Hib) vaccine. A previously unvaccinated person with asplenia or sickle cell disease or having a scheduled splenectomy should receive 1 dose of Hib vaccine. Regardless of previous immunization, a recipient of a hematopoietic stem cell transplant should receive a 3-dose series 6-12 months after her successful transplant. Hib vaccine is not recommended for adults with HIV infection. Preventive Services / Frequency Ages 64 to 68 years  Blood pressure check.** / Every 1 to 2 years.  Lipid and cholesterol check.** / Every 5 years beginning at age  22.  Clinical breast exam.** / Every 3 years for women in their 88s and 53s.  BRCA-related cancer risk assessment.** / For women who have family members with a BRCA-related cancer (breast, ovarian, tubal, or peritoneal cancers).  Pap test.** / Every 2 years from ages 90 through 51. Every 3 years starting at age 21 through age 56 or 3 with a history of 3 consecutive normal Pap tests.  HPV screening.** / Every 3 years from ages 24 through ages 1 to 46 with a history of 3 consecutive normal Pap tests.  Hepatitis C blood test.** / For any individual with known risks for hepatitis C.  Skin self-exam. / Monthly.  Influenza vaccine. / Every year.  Tetanus, diphtheria, and acellular pertussis (Tdap, Td) vaccine.** / Consult your health care provider. Pregnant women should receive 1 dose of Tdap vaccine during each pregnancy. 1 dose of Td every 10 years.  Varicella vaccine.** / Consult your health care provider. Pregnant females who do not have evidence of immunity should receive the first dose after pregnancy.  HPV vaccine. / 3 doses over 6 months, if 72 and younger. The vaccine is not recommended for use in pregnant females. However, pregnancy testing is not needed before receiving a dose.  Measles, mumps, rubella (MMR) vaccine.** / You need at least 1 dose of MMR if you were born in 1957 or later. You may also need a 2nd dose. For females of childbearing age, rubella immunity should be determined. If there is no evidence of immunity, females who are not pregnant should be vaccinated. If there is no evidence of immunity, females who are pregnant should delay immunization until after pregnancy.  Pneumococcal 13-valent conjugate (PCV13) vaccine.** / Consult your health care provider.  Pneumococcal polysaccharide (PPSV23) vaccine.** / 1 to 2 doses if you smoke cigarettes or if you have certain conditions.  Meningococcal vaccine.** /  1 dose if you are age 19 to 21 years and a first-year college  student living in a residence hall, or have one of several medical conditions, you need to get vaccinated against meningococcal disease. You may also need additional booster doses.  Hepatitis A vaccine.** / Consult your health care provider.  Hepatitis B vaccine.** / Consult your health care provider.  Haemophilus influenzae type b (Hib) vaccine.** / Consult your health care provider. Ages 40 to 64 years  Blood pressure check.** / Every 1 to 2 years.  Lipid and cholesterol check.** / Every 5 years beginning at age 20 years.  Lung cancer screening. / Every year if you are aged 55-80 years and have a 30-pack-year history of smoking and currently smoke or have quit within the past 15 years. Yearly screening is stopped once you have quit smoking for at least 15 years or develop a health problem that would prevent you from having lung cancer treatment.  Clinical breast exam.** / Every year after age 40 years.  BRCA-related cancer risk assessment.** / For women who have family members with a BRCA-related cancer (breast, ovarian, tubal, or peritoneal cancers).  Mammogram.** / Every year beginning at age 40 years and continuing for as long as you are in good health. Consult with your health care provider.  Pap test.** / Every 3 years starting at age 30 years through age 65 or 70 years with a history of 3 consecutive normal Pap tests.  HPV screening.** / Every 3 years from ages 30 years through ages 65 to 70 years with a history of 3 consecutive normal Pap tests.  Fecal occult blood test (FOBT) of stool. / Every year beginning at age 50 years and continuing until age 75 years. You may not need to do this test if you get a colonoscopy every 10 years.  Flexible sigmoidoscopy or colonoscopy.** / Every 5 years for a flexible sigmoidoscopy or every 10 years for a colonoscopy beginning at age 50 years and continuing until age 75 years.  Hepatitis C blood test.** / For all people born from 1945 through  1965 and any individual with known risks for hepatitis C.  Skin self-exam. / Monthly.  Influenza vaccine. / Every year.  Tetanus, diphtheria, and acellular pertussis (Tdap/Td) vaccine.** / Consult your health care provider. Pregnant women should receive 1 dose of Tdap vaccine during each pregnancy. 1 dose of Td every 10 years.  Varicella vaccine.** / Consult your health care provider. Pregnant females who do not have evidence of immunity should receive the first dose after pregnancy.  Zoster vaccine.** / 1 dose for adults aged 60 years or older.  Measles, mumps, rubella (MMR) vaccine.** / You need at least 1 dose of MMR if you were born in 1957 or later. You may also need a 2nd dose. For females of childbearing age, rubella immunity should be determined. If there is no evidence of immunity, females who are not pregnant should be vaccinated. If there is no evidence of immunity, females who are pregnant should delay immunization until after pregnancy.  Pneumococcal 13-valent conjugate (PCV13) vaccine.** / Consult your health care provider.  Pneumococcal polysaccharide (PPSV23) vaccine.** / 1 to 2 doses if you smoke cigarettes or if you have certain conditions.  Meningococcal vaccine.** / Consult your health care provider.  Hepatitis A vaccine.** / Consult your health care provider.  Hepatitis B vaccine.** / Consult your health care provider.  Haemophilus influenzae type b (Hib) vaccine.** / Consult your health care provider. Ages 65   years and over  Blood pressure check.** / Every 1 to 2 years.  Lipid and cholesterol check.** / Every 5 years beginning at age 22 years.  Lung cancer screening. / Every year if you are aged 73-80 years and have a 30-pack-year history of smoking and currently smoke or have quit within the past 15 years. Yearly screening is stopped once you have quit smoking for at least 15 years or develop a health problem that would prevent you from having lung cancer  treatment.  Clinical breast exam.** / Every year after age 4 years.  BRCA-related cancer risk assessment.** / For women who have family members with a BRCA-related cancer (breast, ovarian, tubal, or peritoneal cancers).  Mammogram.** / Every year beginning at age 40 years and continuing for as long as you are in good health. Consult with your health care provider.  Pap test.** / Every 3 years starting at age 9 years through age 34 or 91 years with 3 consecutive normal Pap tests. Testing can be stopped between 65 and 70 years with 3 consecutive normal Pap tests and no abnormal Pap or HPV tests in the past 10 years.  HPV screening.** / Every 3 years from ages 57 years through ages 64 or 45 years with a history of 3 consecutive normal Pap tests. Testing can be stopped between 65 and 70 years with 3 consecutive normal Pap tests and no abnormal Pap or HPV tests in the past 10 years.  Fecal occult blood test (FOBT) of stool. / Every year beginning at age 15 years and continuing until age 17 years. You may not need to do this test if you get a colonoscopy every 10 years.  Flexible sigmoidoscopy or colonoscopy.** / Every 5 years for a flexible sigmoidoscopy or every 10 years for a colonoscopy beginning at age 86 years and continuing until age 71 years.  Hepatitis C blood test.** / For all people born from [birth year] through 1965 and any individual with known risks for hepatitis C.  Osteoporosis screening.** / A one-time screening for women ages 83 years and over and women at risk for fractures or osteoporosis.  Skin self-exam. / Monthly.  Influenza vaccine. / Every year.  Tetanus, diphtheria, and acellular pertussis (Tdap/Td) vaccine.** / 1 dose of Td every 10 years.  Varicella vaccine.** / Consult your health care provider.  Zoster vaccine.** / 1 dose for adults aged 61 years or older.  Pneumococcal 13-valent conjugate (PCV13) vaccine.** / Consult your health care provider.  Pneumococcal  polysaccharide (PPSV23) vaccine.** / 1 dose for all adults aged 28 years and older.  Meningococcal vaccine.** / Consult your health care provider.  Hepatitis A vaccine.** / Consult your health care provider.  Hepatitis B vaccine.** / Consult your health care provider.  Haemophilus influenzae type b (Hib) vaccine.** / Consult your health care provider. ** Family history and personal history of risk and conditions may change your health care provider's recommendations. Document Released: 01/29/2002 Document Revised: 04/19/2014 Document Reviewed: 04/30/2011 [facility] Patient Information 2015 [place], [place]. This information is not intended to replace advice given to you by your health care provider. Make sure you discuss any questions you have with your health care provider.

## 2015-02-07 NOTE — Assessment & Plan Note (Signed)
She is doing well on klonopin. Will continue.

## 2015-03-01 ENCOUNTER — Other Ambulatory Visit: Payer: Self-pay

## 2015-03-01 MED ORDER — BUPROPION HCL 75 MG PO TABS: 75.0000 mg | ORAL_TABLET | Freq: Every day | ORAL | Status: DC

## 2015-04-14 ENCOUNTER — Ambulatory Visit (INDEPENDENT_AMBULATORY_CARE_PROVIDER_SITE_OTHER): Payer: BLUE CROSS/BLUE SHIELD | Admitting: Internal Medicine

## 2015-04-14 ENCOUNTER — Other Ambulatory Visit (INDEPENDENT_AMBULATORY_CARE_PROVIDER_SITE_OTHER): Payer: BLUE CROSS/BLUE SHIELD

## 2015-04-14 ENCOUNTER — Encounter: Payer: Self-pay | Admitting: Internal Medicine

## 2015-04-14 VITALS — BP 118/80 | HR 67 | Temp 98.7°F | Resp 16 | Ht 66.0 in | Wt 146.0 lb

## 2015-04-14 DIAGNOSIS — R55 Syncope and collapse: Secondary | ICD-10-CM

## 2015-04-14 LAB — CBC WITH DIFFERENTIAL/PLATELET
BASOS PCT: 0.3 % (ref 0.0–3.0)
Basophils Absolute: 0 10*3/uL (ref 0.0–0.1)
Eosinophils Absolute: 0.3 10*3/uL (ref 0.0–0.7)
Eosinophils Relative: 3.3 % (ref 0.0–5.0)
HEMATOCRIT: 40.3 % (ref 36.0–46.0)
Hemoglobin: 13.8 g/dL (ref 12.0–15.0)
LYMPHS ABS: 2.5 10*3/uL (ref 0.7–4.0)
LYMPHS PCT: 32.3 % (ref 12.0–46.0)
MCHC: 34.2 g/dL (ref 30.0–36.0)
MCV: 84.3 fl (ref 78.0–100.0)
MONOS PCT: 7.8 % (ref 3.0–12.0)
Monocytes Absolute: 0.6 10*3/uL (ref 0.1–1.0)
Neutro Abs: 4.4 10*3/uL (ref 1.4–7.7)
Neutrophils Relative %: 56.3 % (ref 43.0–77.0)
Platelets: 338 10*3/uL (ref 150.0–400.0)
RBC: 4.78 Mil/uL (ref 3.87–5.11)
RDW: 12.9 % (ref 11.5–15.5)
WBC: 7.7 10*3/uL (ref 4.0–10.5)

## 2015-04-14 LAB — COMPREHENSIVE METABOLIC PANEL
ALBUMIN: 3.9 g/dL (ref 3.5–5.2)
ALT: 12 U/L (ref 0–35)
AST: 14 U/L (ref 0–37)
Alkaline Phosphatase: 59 U/L (ref 39–117)
BUN: 12 mg/dL (ref 6–23)
CO2: 27 meq/L (ref 19–32)
Calcium: 9.1 mg/dL (ref 8.4–10.5)
Chloride: 103 mEq/L (ref 96–112)
Creatinine, Ser: 0.76 mg/dL (ref 0.40–1.20)
GFR: 92.47 mL/min (ref >60.00)
Glucose, Bld: 83 mg/dL (ref 70–99)
POTASSIUM: 4.3 meq/L (ref 3.5–5.1)
SODIUM: 137 meq/L (ref 135–145)
TOTAL PROTEIN: 6.7 g/dL (ref 6.0–8.3)
Total Bilirubin: 0.6 mg/dL (ref 0.2–1.2)

## 2015-04-14 LAB — HCG, QUANTITATIVE, PREGNANCY: QUANTITATIVE HCG: 0.24 m[IU]/mL

## 2015-04-14 LAB — TSH: TSH: 1.52 u[IU]/mL (ref 0.35–4.50)

## 2015-04-14 NOTE — Patient Instructions (Signed)
Syncope °Syncope is a medical term for fainting or passing out. This means you lose consciousness and drop to the ground. People are generally unconscious for less than 5 minutes. You may have some muscle twitches for up to 15 seconds before waking up and returning to normal. Syncope occurs more often in older adults, but it can happen to anyone. While most causes of syncope are not dangerous, syncope can be a sign of a serious medical problem. It is important to seek medical care.  °CAUSES  °Syncope is caused by a sudden drop in blood flow to the brain. The specific cause is often not determined. Factors that can bring on syncope include: °· Taking medicines that lower blood pressure. °· Sudden changes in posture, such as standing up quickly. °· Taking more medicine than prescribed. °· Standing in one place for too long. °· Seizure disorders. °· Dehydration and excessive exposure to heat. °· Low blood sugar (hypoglycemia). °· Straining to have a bowel movement. °· Heart disease, irregular heartbeat, or other circulatory problems. °· Fear, emotional distress, seeing blood, or severe pain. °SYMPTOMS  °Right before fainting, you may: °· Feel dizzy or light-headed. °· Feel nauseous. °· See all white or all black in your field of vision. °· Have cold, clammy skin. °DIAGNOSIS  °Your health care provider will ask about your symptoms, perform a physical exam, and perform an electrocardiogram (ECG) to record the electrical activity of your heart. Your health care provider may also perform other heart or blood tests to determine the cause of your syncope which may include: °· Transthoracic echocardiogram (TTE). During echocardiography, sound waves are used to evaluate how blood flows through your heart. °· Transesophageal echocardiogram (TEE). °· Cardiac monitoring. This allows your health care provider to monitor your heart rate and rhythm in real time. °· Holter monitor. This is a portable device that records your  heartbeat and can help diagnose heart arrhythmias. It allows your health care provider to track your heart activity for several days, if needed. °· Stress tests by exercise or by giving medicine that makes the heart beat faster. °TREATMENT  °In most cases, no treatment is needed. Depending on the cause of your syncope, your health care provider may recommend changing or stopping some of your medicines. °HOME CARE INSTRUCTIONS °· Have someone stay with you until you feel stable. °· Do not drive, use machinery, or play sports until your health care provider says it is okay. °· Keep all follow-up appointments as directed by your health care provider. °· Lie down right away if you start feeling like you might faint. Breathe deeply and steadily. Wait until all the symptoms have passed. °· Drink enough fluids to keep your urine clear or pale yellow. °· If you are taking blood pressure or heart medicine, get up slowly and take several minutes to sit and then stand. This can reduce dizziness. °SEEK IMMEDIATE MEDICAL CARE IF:  °· You have a severe headache. °· You have unusual pain in the chest, abdomen, or back. °· You are bleeding from your mouth or rectum, or you have black or tarry stool. °· You have an irregular or very fast heartbeat. °· You have pain with breathing. °· You have repeated fainting or seizure-like jerking during an episode. °· You faint when sitting or lying down. °· You have confusion. °· You have trouble walking. °· You have severe weakness. °· You have vision problems. °If you fainted, call your local emergency services (911 in [place] Do not drive   yourself to the hospital.  °MAKE SURE YOU: °· Understand these instructions. °· Will watch your condition. °· Will get help right away if you are not doing well or get worse. °Document Released: 12/03/2005 Document Revised: 12/08/2013 Document Reviewed: 02/01/2012 °[employer]® Patient Information ©2015 [employer]. This information is not intended to replace  advice given to you by your health care provider. Make sure you discuss any questions you have with your health care provider. ° °

## 2015-04-14 NOTE — Progress Notes (Signed)
Pre visit review using our clinic review tool, if applicable. No additional management support is needed unless otherwise documented below in the visit note. 

## 2015-04-15 ENCOUNTER — Encounter: Payer: Self-pay | Admitting: Internal Medicine

## 2015-04-15 ENCOUNTER — Telehealth: Payer: Self-pay | Admitting: Internal Medicine

## 2015-04-15 LAB — D-DIMER, QUANTITATIVE: D-Dimer, Quant: 0.27 ug/mL-FEU (ref 0.00–0.48)

## 2015-04-15 NOTE — Telephone Encounter (Signed)
Pt called in and is wanting to know the results of her labs.  She is aware Dr Maudry Mayhewjone is not in office today.    Best number [phone]

## 2015-04-17 NOTE — Progress Notes (Signed)
Subjective:    Patient ID: Erin Mays, female    DOB: Feb 23, 1981, 34 y.o.   MRN: [id number]  HPI Comments: She fainted 2 days ago - she was at an after hours work event - she felt the warning signs of her usual migraine with a dull headache, the room was very hot, she had a few glasses of wine and started feeling weak and slid down to a chair, she did not injure herself, she felt woozy for about 10-15 minutes then got up and was fine. She has felt well since then.  Loss of Consciousness Pertinent negatives include no abdominal pain, chest pain, confusion, diaphoresis, dizziness, fever, headaches, light-headedness, nausea, palpitations, vomiting or weakness.      Review of Systems  Constitutional: Negative.  Negative for fever, chills, diaphoresis, appetite change and fatigue.  HENT: Negative.   Eyes: Negative.   Respiratory: Negative.  Negative for cough, choking, chest tightness, shortness of breath and stridor.   Cardiovascular: Positive for syncope. Negative for chest pain, palpitations and leg swelling.  Gastrointestinal: Negative.  Negative for nausea, vomiting, abdominal pain, diarrhea, constipation and blood in stool.  Endocrine: Negative.   Genitourinary: Negative.   Musculoskeletal: Negative.  Negative for neck pain.  Skin: Negative.   Allergic/Immunologic: Negative.   Neurological: Positive for syncope. Negative for dizziness, tremors, seizures, facial asymmetry, speech difficulty, weakness, light-headedness, numbness and headaches.  Hematological: Negative.  Negative for adenopathy. Does not bruise/bleed easily.  Psychiatric/Behavioral: Negative for behavioral problems, confusion, sleep disturbance, self-injury, dysphoric mood and decreased concentration. The patient is nervous/anxious. The patient is not hyperactive.        Objective:   Physical Exam  Constitutional: She is oriented to person, place, and time. She appears well-developed and well-nourished.   Non-toxic appearance. She does not have a sickly appearance. She does not appear ill. No distress.  HENT:  Head: Normocephalic and atraumatic.  Mouth/Throat: Oropharynx is clear and moist. No oropharyngeal exudate.  Eyes: Conjunctivae and EOM are normal. Pupils are equal, round, and reactive to light. Right eye exhibits no discharge. Left eye exhibits no discharge. No scleral icterus. Right eye exhibits normal extraocular motion and no nystagmus. Left eye exhibits normal extraocular motion and no nystagmus. Right pupil is round and reactive. Left pupil is round and reactive. Pupils are equal.  Neck: Normal range of motion. Neck supple. No JVD present. No tracheal deviation present. No thyromegaly present.  Cardiovascular: Normal rate, regular rhythm, normal heart sounds and intact distal pulses.  Exam reveals no gallop and no friction rub.   No murmur heard. Pulmonary/Chest: Effort normal and breath sounds normal. No stridor. No respiratory distress. She has no wheezes. She has no rales. She exhibits no tenderness.  Abdominal: Soft. Bowel sounds are normal. She exhibits no distension and no mass. There is no tenderness. There is no rebound and no guarding.  Musculoskeletal: Normal range of motion. She exhibits no edema or tenderness.  Lymphadenopathy:    She has no cervical adenopathy.  Neurological: She is oriented to person, place, and time.  Skin: Skin is warm and dry. No rash noted. She is not diaphoretic. No erythema. No pallor.  Psychiatric: She has a normal mood and affect. Her behavior is normal. Judgment and thought content normal.  Vitals reviewed.    Lab Results  Component Value Date   WBC 7.7 04/14/2015   HGB 13.8 04/14/2015   HCT 40.3 04/14/2015   PLT 338.0 04/14/2015   GLUCOSE 83 04/14/2015   CHOL 249* 05/22/2014  TRIG 116 05/22/2014   HDL 78* 05/22/2014   LDLDIRECT 147.9 10/29/2013   LDLCALC 148 05/22/2014   ALT 12 04/14/2015   AST 14 04/14/2015   NA 137 04/14/2015     K 4.3 04/14/2015   CL 103 04/14/2015   CREATININE 0.76 04/14/2015   BUN 12 04/14/2015   CO2 27 04/14/2015   TSH 1.52 04/14/2015       Assessment & Plan:

## 2015-04-17 NOTE — Assessment & Plan Note (Addendum)
Her exam and labs are WNL  Her EKG is WNL She did not have any s/s c/w a seizure It appears that she had an uncomplicated vasovagal syncopal episode She will avoid the elements that led up to this She will let me know it this recurs or if she develops any new symptoms

## 2015-04-19 NOTE — Telephone Encounter (Signed)
Patient wanting to know if there is any kind of follow up she needs to do. I did read Dr. Yetta BarreJones note that he sent to her that they are all normal. Please advise patient

## 2015-04-20 NOTE — Telephone Encounter (Signed)
She should let me know if she has another episode of syncope Otherwise, will recheck her in 2-3 weeks

## 2015-04-20 NOTE — Telephone Encounter (Signed)
Tried calling pt back no answer vm not set up so couldn't leave msg...Erin Mays/lmb

## 2015-04-21 NOTE — Telephone Encounter (Signed)
Tried calling pt again still no answer not able to leave vm due to vm not set-up. Closing encounter...Raechel Chute/lmb

## 2015-04-21 NOTE — Telephone Encounter (Signed)
Tried calling pt again still no answer & vm not set-up can't leave msg.../lmb 

## 2015-05-03 ENCOUNTER — Telehealth: Payer: Self-pay | Admitting: Internal Medicine

## 2015-05-03 MED ORDER — BUPROPION HCL 75 MG PO TABS: 75.0000 mg | ORAL_TABLET | Freq: Every day | ORAL | Status: DC

## 2015-05-03 NOTE — Telephone Encounter (Signed)
Patient requesting a refill for buPROPion (WELLBUTRIN) 75 MG tablet [[id number]][[id number]] pharmacy is [facility] on [address]

## 2015-05-03 NOTE — Telephone Encounter (Signed)
Refill sent to pharmacy.../lmb 

## 2015-05-31 ENCOUNTER — Other Ambulatory Visit: Payer: Self-pay | Admitting: Internal Medicine

## 2015-05-31 ENCOUNTER — Ambulatory Visit (INDEPENDENT_AMBULATORY_CARE_PROVIDER_SITE_OTHER): Payer: BLUE CROSS/BLUE SHIELD | Admitting: Internal Medicine

## 2015-05-31 ENCOUNTER — Telehealth: Payer: Self-pay | Admitting: Internal Medicine

## 2015-05-31 VITALS — BP 110/80 | HR 74 | Temp 97.8°F | Resp 16 | Ht 66.0 in | Wt 143.0 lb

## 2015-05-31 DIAGNOSIS — G43809 Other migraine, not intractable, without status migrainosus: Secondary | ICD-10-CM

## 2015-05-31 NOTE — Patient Instructions (Signed)

## 2015-05-31 NOTE — Progress Notes (Signed)
Subjective:  Patient ID: Erin Mays, female    DOB: July 10, 1981  Age: 34 y.o. MRN: [id number]  CC: Dizziness   HPI Erin Mays presents for follow up after she had a near-syncopal episode a few weeks ago - she has felt well since that time until today when she had another "spell" - she did not feel like she was going to pass out today but noted lightheadedness and the sensation of "hot spots" throughout her body (arms, legs, and torso). She had a mild brief HA but it quickly resolved without treatment.   Outpatient Prescriptions Prior to Visit  Medication Sig Dispense Refill  . ALPRAZolam (XANAX) 0.25 MG tablet Take 1 tablet (0.25 mg total) by mouth 2 (two) times daily as needed for anxiety. 35 tablet 0  . b complex vitamins tablet Take 1 tablet by mouth daily.    Marland Kitchen buPROPion (WELLBUTRIN) 75 MG tablet Take 1 tablet (75 mg total) by mouth daily. 90 tablet 3  . clonazePAM (KLONOPIN) 1 MG tablet Take 1 tablet (1 mg total) by mouth 3 (three) times daily as needed. for anxiety 90 tablet 4  . GIANVI 3-0.02 MG tablet TAKE 1 TABLET DAILY 3 tablet 2  . loratadine (CLARITIN) 10 MG tablet Take 10 mg by mouth as needed.     . mometasone (NASONEX) 50 MCG/ACT nasal spray Place 2 sprays into the nose as needed.     . Multiple Vitamin (MULTIVITAMIN) tablet Take 1 tablet by mouth daily.      . Omega-3 Fatty Acids (OMEGA 3 PO) Take by mouth.    . sertraline (ZOLOFT) 100 MG tablet Take 1 tablet (100 mg total) by mouth daily. Take 50- 75 mg 90 tablet 3   No facility-administered medications prior to visit.    ROS Review of Systems  Constitutional: Negative.  Negative for fever, chills, diaphoresis, appetite change and fatigue.  HENT: Negative for trouble swallowing and voice change.   Eyes: Negative.   Respiratory: Negative.  Negative for cough, choking, chest tightness, shortness of breath and stridor.   Cardiovascular: Negative.  Negative for chest pain, palpitations and leg swelling.    Gastrointestinal: Negative.  Negative for nausea, vomiting and abdominal pain.  Endocrine: Negative.   Genitourinary: Negative.   Musculoskeletal: Negative.   Skin: Negative.   Allergic/Immunologic: Negative.   Neurological: Positive for light-headedness and headaches. Negative for dizziness, tremors, seizures, syncope, facial asymmetry, speech difficulty, weakness and numbness.  Hematological: Negative.  Negative for adenopathy. Does not bruise/bleed easily.  Psychiatric/Behavioral: Positive for sleep disturbance. Negative for suicidal ideas, hallucinations, behavioral problems, confusion, self-injury, dysphoric mood, decreased concentration and agitation. The patient is nervous/anxious. The patient is not hyperactive.     Objective:  BP 110/80 mmHg  Pulse 74  Temp(Src) 97.8 F (36.6 C) (Oral)  Resp 16  Ht 5\' 6"  (1.676 m)  Wt 143 lb (64.864 kg)  BMI 23.09 kg/m2  SpO2 99%  BP Readings from Last 3 Encounters:  05/31/15 110/80  04/14/15 118/80  02/07/15 120/76    Wt Readings from Last 3 Encounters:  05/31/15 143 lb (64.864 kg)  04/14/15 146 lb (66.225 kg)  02/07/15 143 lb 1.3 oz (64.901 kg)    Physical Exam  Constitutional: She is oriented to person, place, and time. She appears well-developed and well-nourished. No distress.  HENT:  Head: Normocephalic and atraumatic.  Mouth/Throat: Oropharynx is clear and moist. No oropharyngeal exudate.  Eyes: Conjunctivae and EOM are normal. Pupils are equal, round, and reactive to light. Right  eye exhibits no discharge. Left eye exhibits no discharge. No scleral icterus.  Neck: Normal range of motion. Neck supple. No JVD present. No tracheal deviation present. No thyromegaly present.  Cardiovascular: Normal rate, regular rhythm, normal heart sounds and intact distal pulses.  Exam reveals no gallop and no friction rub.   No murmur heard. Pulmonary/Chest: Effort normal and breath sounds normal. No stridor. No respiratory distress. She  has no wheezes. She has no rales. She exhibits no tenderness.  Abdominal: Soft. Bowel sounds are normal. She exhibits no distension and no mass. There is no tenderness. There is no rebound and no guarding.  Musculoskeletal: Normal range of motion. She exhibits no edema or tenderness.  Lymphadenopathy:    She has no cervical adenopathy.  Neurological: She is alert and oriented to person, place, and time. She has normal reflexes. She displays normal reflexes. No cranial nerve deficit. She exhibits normal muscle tone. Coordination normal.  Skin: Skin is warm and dry. No rash noted. She is not diaphoretic. No erythema. No pallor.  Psychiatric: She has a normal mood and affect. Her behavior is normal. Judgment and thought content normal.  Vitals reviewed.   Lab Results  Component Value Date   WBC 7.7 04/14/2015   HGB 13.8 04/14/2015   HCT 40.3 04/14/2015   PLT 338.0 04/14/2015   GLUCOSE 83 04/14/2015   CHOL 249* 05/22/2014   TRIG 116 05/22/2014   HDL 78* 05/22/2014   LDLDIRECT 147.9 10/29/2013   LDLCALC 148 05/22/2014   ALT 12 04/14/2015   AST 14 04/14/2015   NA 137 04/14/2015   K 4.3 04/14/2015   CL 103 04/14/2015   CREATININE 0.76 04/14/2015   BUN 12 04/14/2015   CO2 27 04/14/2015   TSH 1.52 04/14/2015    No results found.  Assessment & Plan:   Diagnoses and all orders for this visit:  Other type of migraine - her s/s appear to be atypical migraine HA's, she has already been referred to neurology, she does not request any further treatment at this time   I am having Ms. Skeen maintain her multivitamin, mometasone, loratadine, GIANVI, clonazePAM, b complex vitamins, Omega-3 Fatty Acids (OMEGA 3 PO), sertraline, ALPRAZolam, and buPROPion.  No orders of the defined types were placed in this encounter.     Follow-up: Return if symptoms worsen or fail to improve.  Sanda Linger, MD

## 2015-05-31 NOTE — Telephone Encounter (Signed)
Patient is requesting a referral to a specialist for her migraines. She is going to schedule a follow up appt with [place] as well

## 2015-06-02 ENCOUNTER — Encounter: Payer: Self-pay | Admitting: Internal Medicine

## 2015-07-29 ENCOUNTER — Other Ambulatory Visit: Payer: Self-pay

## 2015-07-29 ENCOUNTER — Ambulatory Visit (INDEPENDENT_AMBULATORY_CARE_PROVIDER_SITE_OTHER): Payer: BLUE CROSS/BLUE SHIELD | Admitting: Neurology

## 2015-07-29 ENCOUNTER — Encounter: Payer: Self-pay | Admitting: Neurology

## 2015-07-29 VITALS — BP 110/80 | HR 62 | Resp 18 | Ht 66.0 in | Wt 141.9 lb

## 2015-07-29 DIAGNOSIS — R55 Syncope and collapse: Secondary | ICD-10-CM | POA: Diagnosis not present

## 2015-07-29 DIAGNOSIS — R42 Dizziness and giddiness: Secondary | ICD-10-CM | POA: Insufficient documentation

## 2015-07-29 NOTE — Patient Instructions (Signed)
I don't suspect that your spells are neurologic.  I don't believe they are migraines.  Therefore, I do not recommend further neurologic workup.  You may consider a cardiac workup.

## 2015-07-29 NOTE — Telephone Encounter (Signed)
Last OV 06.14.2016.

## 2015-07-29 NOTE — Progress Notes (Signed)
NEUROLOGY CONSULTATION NOTE  Erin Mays MRN: [id number] DOB: Apr 06, 1981  Referring provider: Dr. Yetta Barre Primary care provider: Dr. Yetta Barre  Reason for consult:  Migraines  HISTORY OF PRESENT ILLNESS: Erin Mays is a 34 year old right-handed female with generalized anxiety disorder and depression who presents for migraine.  History obtained by patient and PCP note.  EKG and labs reviewed.  Since she was 34 years old, she has had infrequent episodes of lightheadedness, feeling like she would pass out.  It is accompanied by nausea, feeling hot, whiting out of vision and chest discomfort.  It may be triggered by heat, such as room temperature or after exercising.  She usually will sit down in a cool place and the sensation will resolve after a few minutes.  There is no associated headache, however she does get bi-temporal tension-type headaches, possibly associated with TMJ dysfunction.  She always assumed these were migraines because her mother has migraines.  On 3 occasions, she did pass out.  One time, it occurred after donating blood.  3 months ago, she was at an event.  It was hot in the room.  She then passed out for about a minute.  She briefly was jerking.  There was no incontinence or tongue biting.  She slurred her speech a little when she woke up.  EKG and labs were unremarkable.  She had two episodes of hitting her head as a child but she cannot remember if she lost consciousness.  She has no personal history of migraine headaches or seizures.  In her family, her mother, aunt and cousins have migraines.  Her maternal cousin has epilepsy.  PAST MEDICAL HISTORY: Past Medical History  Diagnosis Date  . Depression   . Allergy   . Anxiety   . Hyperlipidemia   . Headache     PAST SURGICAL HISTORY: Past Surgical History  Procedure Laterality Date  . Appendectomy      MEDICATIONS: Current Outpatient Prescriptions on File Prior to Visit  Medication Sig Dispense  Refill  . ALPRAZolam (XANAX) 0.25 MG tablet Take 1 tablet (0.25 mg total) by mouth 2 (two) times daily as needed for anxiety. 35 tablet 0  . b complex vitamins tablet Take 1 tablet by mouth daily.    . clonazePAM (KLONOPIN) 1 MG tablet Take 1 tablet (1 mg total) by mouth 3 (three) times daily as needed. for anxiety 90 tablet 4  . loratadine (CLARITIN) 10 MG tablet Take 10 mg by mouth as needed.     . mometasone (NASONEX) 50 MCG/ACT nasal spray Place 2 sprays into the nose as needed.     . Multiple Vitamin (MULTIVITAMIN) tablet Take 1 tablet by mouth daily.      . Omega-3 Fatty Acids (OMEGA 3 PO) Take by mouth.    . sertraline (ZOLOFT) 100 MG tablet Take 1 tablet (100 mg total) by mouth daily. Take 50- 75 mg 90 tablet 3  . buPROPion (WELLBUTRIN) 75 MG tablet Take 1 tablet (75 mg total) by mouth daily. (Patient not taking: Reported on 07/29/2015) 90 tablet 3  . GIANVI 3-0.02 MG tablet TAKE 1 TABLET DAILY (Patient not taking: Reported on 07/29/2015) 3 tablet 2   No current facility-administered medications on file prior to visit.    ALLERGIES: No Known Allergies  FAMILY HISTORY: Family History  Problem Relation Age of Onset  . Diabetes Maternal Aunt     breast cancer- great aunt  . Diabetes Maternal Grandfather   . Depression Other   .  Hyperlipidemia Other   . Hypertension Other   . Hypertension Mother   . Cancer Paternal Grandmother     GIST  . Depression Sister   . Depression Brother     SOCIAL HISTORY: Social History   Social History  . Marital Status: Single    Spouse Name: N/A  . Number of Children: N/A  . Years of Education: N/A   Occupational History  . credit analyst     [employer]   Social History Main Topics  . Smoking status: Never Smoker   . Smokeless tobacco: Never Used  . Alcohol Use: 1.8 oz/week    3 Glasses of wine per week  . Drug Use: No  . Sexual Activity:    Partners: Male    Birth Control/ Protection: Pill   Other Topics Concern  . Not on file    Social History Narrative   Domestic Partner   Pt is a Multimedia programmer   Regular exercise-Yes    REVIEW OF SYSTEMS: Constitutional: No fevers, chills, or sweats, no generalized fatigue, change in appetite Eyes: No visual changes, double vision, eye pain Ear, nose and throat: No hearing loss, ear pain, nasal congestion, sore throat Cardiovascular: No chest pain, palpitations Respiratory:  No shortness of breath at rest or with exertion, wheezes GastrointestinaI: No nausea, vomiting, diarrhea, abdominal pain, fecal incontinence Genitourinary:  No dysuria, urinary retention or frequency Musculoskeletal:  No neck pain, back pain Integumentary: No rash, pruritus, skin lesions Neurological: as above Psychiatric: No depression, insomnia, anxiety Endocrine: No palpitations, fatigue, diaphoresis, mood swings, change in appetite, change in weight, increased thirst Hematologic/Lymphatic:  No anemia, purpura, petechiae. Allergic/Immunologic: no itchy/runny eyes, nasal congestion, recent allergic reactions, rashes  PHYSICAL EXAM: Filed Vitals:   07/29/15 1513  BP: 110/80  Pulse:   Resp:    General: No acute distress.  Patient appears well-groomed.  normal body habitus. Head:  Normocephalic/atraumatic Eyes:  fundi unremarkable, without vessel changes, exudates, hemorrhages or papilledema. Neck: supple, no paraspinal tenderness, full range of motion Back: No paraspinal tenderness Heart: regular rate and rhythm Lungs: Clear to auscultation bilaterally. Vascular: No carotid bruits. Neurological Exam: Mental status: alert and oriented to person, place, and time, recent and remote memory intact, fund of knowledge intact, attention and concentration intact, speech fluent and not dysarthric, language intact. Cranial nerves: CN I: not tested CN II: pupils equal, round and reactive to light, visual fields intact, fundi unremarkable, without vessel changes, exudates, hemorrhages or papilledema. CN  III, IV, VI:  full range of motion, no nystagmus, no ptosis CN V: facial sensation intact CN VII: upper and lower face symmetric CN VIII: hearing intact CN IX, X: gag intact, uvula midline CN XI: sternocleidomastoid and trapezius muscles intact CN XII: tongue midline Bulk & Tone: normal, no fasciculations. Motor:  5/5 throughout Sensation:  Temperature and vibration intact Deep Tendon Reflexes:  2+ throughout, toes downgoing Finger to nose testing:  intact Heel to shin:  intact Gait:  Normal station and stride.  Able to turn and tandem walk. Romberg negative.  IMPRESSION: Episodes of lightheadedness Syncope  These spells do not sound neurologic.  They are not migraines and they do not seem like seizures.  Therefore, I don't recommend any neurologic workup.  I would consider looking for other causes of presyncope, such as cardiac.  Thank you for allowing me to take part in the care of this patient.  Shon Millet, DO  CC:  Sanda Linger, MD

## 2015-07-30 MED ORDER — CLONAZEPAM 1 MG PO TABS: 1.0000 mg | ORAL_TABLET | Freq: Three times a day (TID) | ORAL | Status: DC | PRN

## 2016-03-21 LAB — HM PAP SMEAR

## 2016-04-11 ENCOUNTER — Ambulatory Visit (INDEPENDENT_AMBULATORY_CARE_PROVIDER_SITE_OTHER): Payer: BLUE CROSS/BLUE SHIELD | Admitting: Family Medicine

## 2016-04-11 ENCOUNTER — Telehealth: Payer: Self-pay | Admitting: Internal Medicine

## 2016-04-11 VITALS — BP 100/80 | HR 112 | Temp 97.7°F | Ht 66.0 in | Wt 136.0 lb

## 2016-04-11 DIAGNOSIS — J019 Acute sinusitis, unspecified: Secondary | ICD-10-CM | POA: Diagnosis not present

## 2016-04-11 MED ORDER — AMOXICILLIN-POT CLAVULANATE 875-125 MG PO TABS: 1.0000 | ORAL_TABLET | Freq: Two times a day (BID) | ORAL | Status: DC

## 2016-04-11 NOTE — Progress Notes (Signed)
Pre visit review using our clinic review tool, if applicable. No additional management support is needed unless otherwise documented below in the visit note. 

## 2016-04-11 NOTE — Progress Notes (Signed)
   Subjective:    Patient ID: Erin Mays, female    DOB: October 04, 1981, 35 y.o.   MRN: [id number]  HPI   Acute visit  Patient seen with upper respiratory symptoms and cough for the past week.  She complains of pansinusitis symptoms and productive cough.  She feels that her symptoms are getting worse over the past few days.  No documented fevers or chills.  Increased malaise. Intermittent sore throat. Intermittent headache.  Using over-the-counter Mucinex without much relief.  Nonsmoker. No known drug allergies.  Past Medical History  Diagnosis Date  . Depression   . Allergy   . Anxiety   . Hyperlipidemia   . Headache    Past Surgical History  Procedure Laterality Date  . Appendectomy      reports that she has never smoked. She has never used smokeless tobacco. She reports that she drinks about 1.8 oz of alcohol per week. She reports that she does not use illicit drugs. family history includes Cancer in her paternal grandmother; Depression in her brother, other, and sister; Diabetes in her maternal aunt and maternal grandfather; Hyperlipidemia in her other; Hypertension in her mother and other. No Known Allergies   Review of Systems  Constitutional: Positive for fatigue. Negative for fever and chills.  HENT: Positive for congestion, sinus pressure and sore throat.   Respiratory: Positive for cough.   Neurological: Positive for headaches.       Objective:   Physical Exam  Constitutional: She appears well-developed and well-nourished.  HENT:  Right Ear: External ear normal.  Left Ear: External ear normal.  Mouth/Throat: Oropharynx is clear and moist.  Neck: Neck supple.  Cardiovascular: Normal rate and regular rhythm.   Pulmonary/Chest: Effort normal and breath sounds normal. No respiratory distress. She has no wheezes. She has no rales.  Lymphadenopathy:    She has no cervical adenopathy.          Assessment & Plan:   Acute sinusitis/bronchitis. We  explained these are usually viral. She feels her symptoms are worsening > one week into illness.    Progressive facial pain past few days. Continue Mucinex. Augmentin 875 mg twice daily for 10 days

## 2016-04-11 NOTE — Patient Instructions (Signed)

## 2016-04-11 NOTE — Telephone Encounter (Signed)
Patient Name: Erin Mays  DOB: 10/06/1981    Initial Comment caller states she has cough, sore throat and sinus pressure   Nurse Assessment  Nurse: Vickey SagesAtkins, RN, Jacquilin Date/Time (Eastern Time): 04/11/2016 8:13:36 AM  Confirm and document reason for call. If symptomatic, describe symptoms. You must click the next button to save text entered. ---caller states she has cough, sore throat and sinus pressure- No fever at this time. Symptoms started 6 days ago. Pain a 7/10  Has the patient traveled out of the country within the last 30 days? ---No  Does the patient have any new or worsening symptoms? ---Yes  Will a triage be completed? ---Yes  Related visit to physician within the last 2 weeks? ---No  Does the PT have any chronic conditions? (i.e. diabetes, asthma, etc.) ---No  Is the patient pregnant or possibly pregnant? (Ask all females between the ages of 4412-55) ---No  Is this a behavioral health or substance abuse call? ---No     Guidelines    Guideline Title Affirmed Question Affirmed Notes  Sinus Pain or Congestion Lots of coughing    Final Disposition User   See PCP When Office is Open (within 3 days) Vickey SagesAtkins, RN, Jacquilin    Comments  Caller requested to be seen early this AM. Check with PCP office but no appointments. Scheduled with [facility] at 1145   Referrals  REFERRED TO PCP OFFICE   Disagree/Comply: Comply

## 2016-04-12 ENCOUNTER — Ambulatory Visit: Payer: BLUE CROSS/BLUE SHIELD | Admitting: Family

## 2016-04-12 DIAGNOSIS — Z0289 Encounter for other administrative examinations: Secondary | ICD-10-CM

## 2016-07-06 LAB — LIPID PANEL
CHOLESTEROL: 208 mg/dL — AB (ref 0–200)
HDL: 72 mg/dL — AB (ref 35–70)
LDL Cholesterol: 107 mg/dL
LDL/HDL RATIO: 1.5
TRIGLYCERIDES: 147 mg/dL (ref 40–160)

## 2016-07-06 LAB — CBC AND DIFFERENTIAL
HEMATOCRIT: 43 % (ref 36–46)
HEMOGLOBIN: 13.4 g/dL (ref 12.0–16.0)
Neutrophils Absolute: 4 /uL
Platelets: 319 10*3/uL (ref 150–399)
WBC: 7.3 10*3/mL

## 2016-07-06 LAB — TSH: TSH: 1.57 u[IU]/mL (ref 0.41–5.90)

## 2016-07-06 LAB — BASIC METABOLIC PANEL
BUN: 14 mg/dL (ref 4–21)
CREATININE: 0.7 mg/dL (ref 0.5–1.1)
Glucose: 78 mg/dL
POTASSIUM: 4.6 mmol/L (ref 3.4–5.3)
Sodium: 142 mmol/L (ref 137–147)

## 2016-07-06 LAB — HEPATIC FUNCTION PANEL
ALK PHOS: 62 U/L (ref 25–125)
ALT: 8 U/L (ref 7–35)
AST: 14 U/L (ref 13–35)
BILIRUBIN, TOTAL: 0.2 mg/dL

## 2016-07-24 ENCOUNTER — Encounter: Payer: Self-pay | Admitting: Internal Medicine

## 2016-07-24 ENCOUNTER — Ambulatory Visit (INDEPENDENT_AMBULATORY_CARE_PROVIDER_SITE_OTHER): Payer: BLUE CROSS/BLUE SHIELD | Admitting: Internal Medicine

## 2016-07-24 VITALS — BP 118/80 | HR 98 | Temp 98.0°F | Resp 16 | Ht 66.0 in | Wt 138.2 lb

## 2016-07-24 DIAGNOSIS — F409 Phobic anxiety disorder, unspecified: Secondary | ICD-10-CM | POA: Diagnosis not present

## 2016-07-24 DIAGNOSIS — F450 Somatization disorder: Secondary | ICD-10-CM | POA: Diagnosis not present

## 2016-07-24 DIAGNOSIS — F329 Major depressive disorder, single episode, unspecified: Secondary | ICD-10-CM

## 2016-07-24 DIAGNOSIS — F32A Depression, unspecified: Secondary | ICD-10-CM

## 2016-07-24 DIAGNOSIS — F411 Generalized anxiety disorder: Secondary | ICD-10-CM

## 2016-07-24 DIAGNOSIS — Z0000 Encounter for general adult medical examination without abnormal findings: Secondary | ICD-10-CM | POA: Diagnosis not present

## 2016-07-24 DIAGNOSIS — F5105 Insomnia due to other mental disorder: Secondary | ICD-10-CM

## 2016-07-24 MED ORDER — CLONAZEPAM 1 MG PO TABS: 1.0000 mg | ORAL_TABLET | Freq: Three times a day (TID) | ORAL | 3 refills | Status: DC | PRN

## 2016-07-24 MED ORDER — VORTIOXETINE HBR 20 MG PO TABS: 20.0000 mg | ORAL_TABLET | Freq: Every day | ORAL | 11 refills | Status: DC

## 2016-07-24 NOTE — Progress Notes (Unsigned)
07.21.2017

## 2016-07-24 NOTE — Progress Notes (Signed)
Pre visit review using our clinic review tool, if applicable. No additional management support is needed unless otherwise documented below in the visit note. 

## 2016-07-24 NOTE — Progress Notes (Signed)
Subjective:  Patient ID: Erin Mays, female    DOB: Feb 06, 1981  Age: 35 y.o. MRN: [id number]  CC: Annual Exam and Depression   HPI Erin Mays presents for a CPX. She brings in labs that were done elsewhere and wants me to review them. See scanned documents and abstracted labs.  She complains that sertraline is not helping with her depression. She tells me that she lives with a female partner who is bipolar. She has persistent sadness, anhedonia, difficulty falling asleep, and anxiety. She has had no recent episodes of crying spells and does not feel worthless/helpless or hopeless. She denies SI or HI. She has maintained a normal appetite and weight. She took bupropion years ago but tells me it made her anxiety worse.  Outpatient Medications Prior to Visit  Medication Sig Dispense Refill  . b complex vitamins tablet Take 1 tablet by mouth daily.    Marland Kitchen. GIANVI 3-0.02 MG tablet TAKE 1 TABLET DAILY 3 tablet 2  . loratadine (CLARITIN) 10 MG tablet Take 10 mg by mouth as needed.     . Multiple Vitamin (MULTIVITAMIN) tablet Take 1 tablet by mouth daily.      . Omega-3 Fatty Acids (OMEGA 3 PO) Take by mouth.    . clonazePAM (KLONOPIN) 1 MG tablet Take 1 tablet (1 mg total) by mouth 3 (three) times daily as needed. for anxiety 90 tablet 2  . sertraline (ZOLOFT) 100 MG tablet Take 1 tablet (100 mg total) by mouth daily. Take 50- 75 mg (Patient taking differently: Take 50 mg by mouth daily. Take 50- 75 mg) 90 tablet 3  . amoxicillin-clavulanate (AUGMENTIN) 875-125 MG tablet Take 1 tablet by mouth 2 (two) times daily. 20 tablet 0   No facility-administered medications prior to visit.     ROS Review of Systems  Constitutional: Negative.  Negative for activity change, appetite change, diaphoresis, fatigue and unexpected weight change.  HENT: Negative.   Eyes: Negative.   Respiratory: Negative.  Negative for cough, choking, chest tightness, shortness of breath and stridor.     Cardiovascular: Negative.  Negative for chest pain, palpitations and leg swelling.  Gastrointestinal: Negative.  Negative for abdominal pain, constipation, diarrhea, nausea and vomiting.  Endocrine: Negative.   Genitourinary: Negative.   Musculoskeletal: Negative.  Negative for back pain and myalgias.  Skin: Negative.  Negative for rash.  Allergic/Immunologic: Negative.   Neurological: Negative.  Negative for dizziness, syncope and weakness.  Hematological: Negative for adenopathy. Does not bruise/bleed easily.  Psychiatric/Behavioral: Positive for dysphoric mood and sleep disturbance. Negative for agitation, behavioral problems, decreased concentration, self-injury and suicidal ideas. The patient is nervous/anxious. The patient is not hyperactive.     Objective:  BP 118/80 (BP Location: Left Arm, Patient Position: Sitting, Cuff Size: Normal)   Pulse 98   Temp 98 F (36.7 C) (Oral)   Resp 16   Ht 5\' 6"  (1.676 m)   Wt 138 lb 4 oz (62.7 kg)   LMP 06/15/2016   SpO2 99%   BMI 22.31 kg/m   BP Readings from Last 3 Encounters:  07/24/16 118/80  04/11/16 100/80  07/29/15 110/80    Wt Readings from Last 3 Encounters:  07/24/16 138 lb 4 oz (62.7 kg)  04/11/16 136 lb (61.7 kg)  07/29/15 141 lb 14.4 oz (64.4 kg)    Physical Exam  Constitutional: She is oriented to person, place, and time. No distress.  HENT:  Mouth/Throat: Oropharynx is clear and moist. No oropharyngeal exudate.  Eyes: Conjunctivae are normal.  Right eye exhibits no discharge. Left eye exhibits no discharge. No scleral icterus.  Neck: Normal range of motion. Neck supple. No JVD present. No tracheal deviation present. No thyromegaly present.  Cardiovascular: Normal rate, regular rhythm, normal heart sounds and intact distal pulses.  Exam reveals no gallop and no friction rub.   No murmur heard. Pulmonary/Chest: Effort normal and breath sounds normal. No stridor. No respiratory distress. She has no wheezes. She has no  rales. She exhibits no tenderness.  Abdominal: Soft. Bowel sounds are normal. She exhibits no distension and no mass. There is no tenderness. There is no rebound and no guarding.  Musculoskeletal: Normal range of motion. She exhibits no edema, tenderness or deformity.  Lymphadenopathy:    She has no cervical adenopathy.  Neurological: She is oriented to person, place, and time.  Skin: Skin is warm and dry. No rash noted. She is not diaphoretic. No erythema. No pallor.  Psychiatric: She has a normal mood and affect. Her behavior is normal. Judgment normal. Her mood appears not anxious. Her speech is not delayed and not tangential. She is not aggressive, not slowed and not withdrawn. Cognition and memory are normal. She does not exhibit a depressed mood. She expresses no homicidal and no suicidal ideation. She expresses no suicidal plans and no homicidal plans. She is attentive.  Vitals reviewed.   Lab Results  Component Value Date   WBC 7.3 07/06/2016   HGB 13.4 07/06/2016   HCT 43 07/06/2016   PLT 319 07/06/2016   GLUCOSE 83 04/14/2015   CHOL 208 (A) 07/06/2016   TRIG 147 07/06/2016   HDL 72 (A) 07/06/2016   LDLDIRECT 147.9 10/29/2013   LDLCALC 107 07/06/2016   ALT 8 07/06/2016   AST 14 07/06/2016   NA 142 07/06/2016   K 4.6 07/06/2016   CL 103 04/14/2015   CREATININE 0.7 07/06/2016   BUN 14 07/06/2016   CO2 27 04/14/2015   TSH 1.57 07/06/2016    No results found.  Assessment & Plan:   Erin Mays was seen today for annual exam and depression.  Diagnoses and all orders for this visit:  Depression with somatization- she was given Trintellix 10 mg samples which she will take for 4 weeks and then I've asked her to increase the dose to 20 mg a day. -     vortioxetine HBr (TRINTELLIX) 20 MG TABS; Take 20 mg by mouth daily.  Routine general medical examination at a health care facility- exam completed, recent labs ordered and reviewed the elbow look okay to me, her Pap smears  up-to-date, vaccines reviewed, patient education material was given.  GAD (generalized anxiety disorder) -     clonazePAM (KLONOPIN) 1 MG tablet; Take 1 tablet (1 mg total) by mouth 3 (three) times daily as needed. for anxiety  Insomnia due to anxiety and fear -     clonazePAM (KLONOPIN) 1 MG tablet; Take 1 tablet (1 mg total) by mouth 3 (three) times daily as needed. for anxiety   I have discontinued Ms. Mohon's sertraline and amoxicillin-clavulanate. I am also having her start on vortioxetine HBr. Additionally, I am having her maintain her multivitamin, loratadine, GIANVI, b complex vitamins, Omega-3 Fatty Acids (OMEGA 3 PO), and clonazePAM.  Meds ordered this encounter  Medications  . vortioxetine HBr (TRINTELLIX) 20 MG TABS    Sig: Take 20 mg by mouth daily.    Dispense:  30 tablet    Refill:  11  . clonazePAM (KLONOPIN) 1 MG tablet  Sig: Take 1 tablet (1 mg total) by mouth 3 (three) times daily as needed. for anxiety    Dispense:  90 tablet    Refill:  3     Follow-up: Return in about 4 weeks (around 08/21/2016).  Sanda Linger, MD

## 2016-07-24 NOTE — Patient Instructions (Signed)
Major Depressive Disorder Major depressive disorder is a mental illness. It also may be called clinical depression or unipolar depression. Major depressive disorder usually causes feelings of sadness, hopelessness, or helplessness. Some people with this disorder do not feel particularly sad but lose interest in doing things they used to enjoy (anhedonia). Major depressive disorder also can cause physical symptoms. It can interfere with work, school, relationships, and other normal everyday activities. The disorder varies in severity but is longer lasting and more serious than the sadness we all feel from time to time in our lives. Major depressive disorder often is triggered by stressful life events or major life changes. Examples of these triggers include divorce, loss of your job or home, a move, and the death of a family member or close friend. Sometimes this disorder occurs for no obvious reason at all. People who have family members with major depressive disorder or bipolar disorder are at higher risk for developing this disorder, with or without life stressors. Major depressive disorder can occur at any age. It may occur just once in your life (single episode major depressive disorder). It may occur multiple times (recurrent major depressive disorder). SYMPTOMS People with major depressive disorder have either anhedonia or depressed mood on nearly a daily basis for at least 2 weeks or longer. Symptoms of depressed mood include:  Feelings of sadness (blue or down in the dumps) or emptiness.  Feelings of hopelessness or helplessness.  Tearfulness or episodes of crying (may be observed by others).  Irritability (children and adolescents). In addition to depressed mood or anhedonia or both, people with this disorder have at least four of the following symptoms:  Difficulty sleeping or sleeping too much.   Significant change (increase or decrease) in appetite or weight.   Lack of energy or  motivation.  Feelings of guilt and worthlessness.   Difficulty concentrating, remembering, or making decisions.  Unusually slow movement (psychomotor retardation) or restlessness (as observed by others).   Recurrent wishes for death, recurrent thoughts of self-harm (suicide), or a suicide attempt. People with major depressive disorder commonly have persistent negative thoughts about themselves, other people, and the world. People with severe major depressive disorder may experiencedistorted beliefs or perceptions about the world (psychotic delusions). They also may see or hear things that are not real (psychotic hallucinations). DIAGNOSIS Major depressive disorder is diagnosed through an assessment by your health care provider. Your health care provider will ask aboutaspects of your daily life, such as mood,sleep, and appetite, to see if you have the diagnostic symptoms of major depressive disorder. Your health care provider may ask about your medical history and use of alcohol or drugs, including prescription medicines. Your health care provider also may do a physical exam and blood work. This is because certain medical conditions and the use of certain substances can cause major depressive disorder-like symptoms (secondary depression). Your health care provider also may refer you to a mental health specialist for further evaluation and treatment. TREATMENT It is important to recognize the symptoms of major depressive disorder and seek treatment. The following treatments can be prescribed for this disorder:   Medicine. Antidepressant medicines usually are prescribed. Antidepressant medicines are thought to correct chemical imbalances in the brain that are commonly associated with major depressive disorder. Other types of medicine may be added if the symptoms do not respond to antidepressant medicines alone or if psychotic delusions or hallucinations occur.  Talk therapy. Talk therapy can be  helpful in treating major depressive disorder by providing   support, education, and guidance. Certain types of talk therapy also can help with negative thinking (cognitive behavioral therapy) and with relationship issues that trigger this disorder (interpersonal therapy). A mental health specialist can help determine which treatment is best for you. Most people with major depressive disorder do well with a combination of medicine and talk therapy. Treatments involving electrical stimulation of the brain can be used in situations with extremely severe symptoms or when medicine and talk therapy do not work over time. These treatments include electroconvulsive therapy, transcranial magnetic stimulation, and vagal nerve stimulation.   This information is not intended to replace advice given to you by your health care provider. Make sure you discuss any questions you have with your health care provider.   Document Released: 03/30/2013 Document Revised: 12/24/2014 Document Reviewed: 03/30/2013 [employer] Interactive Patient Education 2016 [employer]  

## 2016-08-07 ENCOUNTER — Encounter: Payer: Self-pay | Admitting: Internal Medicine

## 2016-08-22 ENCOUNTER — Ambulatory Visit (INDEPENDENT_AMBULATORY_CARE_PROVIDER_SITE_OTHER): Payer: BLUE CROSS/BLUE SHIELD | Admitting: Internal Medicine

## 2016-08-22 ENCOUNTER — Encounter: Payer: Self-pay | Admitting: Internal Medicine

## 2016-08-22 VITALS — BP 118/78 | HR 77 | Temp 98.5°F | Resp 16 | Ht 66.0 in | Wt 134.8 lb

## 2016-08-22 DIAGNOSIS — F329 Major depressive disorder, single episode, unspecified: Secondary | ICD-10-CM

## 2016-08-22 DIAGNOSIS — F450 Somatization disorder: Secondary | ICD-10-CM | POA: Diagnosis not present

## 2016-08-22 DIAGNOSIS — F32A Depression, unspecified: Secondary | ICD-10-CM

## 2016-08-22 MED ORDER — SERTRALINE HCL 100 MG PO TABS: 100.0000 mg | ORAL_TABLET | Freq: Every day | ORAL | 1 refills | Status: DC

## 2016-08-22 MED ORDER — ARIPIPRAZOLE 2 MG PO TABS: 2.0000 mg | ORAL_TABLET | Freq: Every day | ORAL | 2 refills | Status: DC

## 2016-08-22 NOTE — Progress Notes (Signed)
Pre visit review using our clinic review tool, if applicable. No additional management support is needed unless otherwise documented below in the visit note. 

## 2016-08-22 NOTE — Patient Instructions (Signed)
Major Depressive Disorder Major depressive disorder is a mental illness. It also may be called clinical depression or unipolar depression. Major depressive disorder usually causes feelings of sadness, hopelessness, or helplessness. Some people with this disorder do not feel particularly sad but lose interest in doing things they used to enjoy (anhedonia). Major depressive disorder also can cause physical symptoms. It can interfere with work, school, relationships, and other normal everyday activities. The disorder varies in severity but is longer lasting and more serious than the sadness we all feel from time to time in our lives. Major depressive disorder often is triggered by stressful life events or major life changes. Examples of these triggers include divorce, loss of your job or home, a move, and the death of a family member or close friend. Sometimes this disorder occurs for no obvious reason at all. People who have family members with major depressive disorder or bipolar disorder are at higher risk for developing this disorder, with or without life stressors. Major depressive disorder can occur at any age. It may occur just once in your life (single episode major depressive disorder). It may occur multiple times (recurrent major depressive disorder). SYMPTOMS People with major depressive disorder have either anhedonia or depressed mood on nearly a daily basis for at least 2 weeks or longer. Symptoms of depressed mood include:  Feelings of sadness (blue or down in the dumps) or emptiness.  Feelings of hopelessness or helplessness.  Tearfulness or episodes of crying (may be observed by others).  Irritability (children and adolescents). In addition to depressed mood or anhedonia or both, people with this disorder have at least four of the following symptoms:  Difficulty sleeping or sleeping too much.   Significant change (increase or decrease) in appetite or weight.   Lack of energy or  motivation.  Feelings of guilt and worthlessness.   Difficulty concentrating, remembering, or making decisions.  Unusually slow movement (psychomotor retardation) or restlessness (as observed by others).   Recurrent wishes for death, recurrent thoughts of self-harm (suicide), or a suicide attempt. People with major depressive disorder commonly have persistent negative thoughts about themselves, other people, and the world. People with severe major depressive disorder may experiencedistorted beliefs or perceptions about the world (psychotic delusions). They also may see or hear things that are not real (psychotic hallucinations). DIAGNOSIS Major depressive disorder is diagnosed through an assessment by your health care provider. Your health care provider will ask aboutaspects of your daily life, such as mood,sleep, and appetite, to see if you have the diagnostic symptoms of major depressive disorder. Your health care provider may ask about your medical history and use of alcohol or drugs, including prescription medicines. Your health care provider also may do a physical exam and blood work. This is because certain medical conditions and the use of certain substances can cause major depressive disorder-like symptoms (secondary depression). Your health care provider also may refer you to a mental health specialist for further evaluation and treatment. TREATMENT It is important to recognize the symptoms of major depressive disorder and seek treatment. The following treatments can be prescribed for this disorder:   Medicine. Antidepressant medicines usually are prescribed. Antidepressant medicines are thought to correct chemical imbalances in the brain that are commonly associated with major depressive disorder. Other types of medicine may be added if the symptoms do not respond to antidepressant medicines alone or if psychotic delusions or hallucinations occur.  Talk therapy. Talk therapy can be  helpful in treating major depressive disorder by providing   support, education, and guidance. Certain types of talk therapy also can help with negative thinking (cognitive behavioral therapy) and with relationship issues that trigger this disorder (interpersonal therapy). A mental health specialist can help determine which treatment is best for you. Most people with major depressive disorder do well with a combination of medicine and talk therapy. Treatments involving electrical stimulation of the brain can be used in situations with extremely severe symptoms or when medicine and talk therapy do not work over time. These treatments include electroconvulsive therapy, transcranial magnetic stimulation, and vagal nerve stimulation.   This information is not intended to replace advice given to you by your health care provider. Make sure you discuss any questions you have with your health care provider.   Document Released: 03/30/2013 Document Revised: 12/24/2014 Document Reviewed: 03/30/2013 [employer] Interactive Patient Education 2016 [employer]  

## 2016-08-22 NOTE — Progress Notes (Signed)
Subjective:  Patient ID: Erin Mays, female    DOB: [birth year]  Age: 35 y.o. MRN: [id number]  CC: Depression   HPI Erin Mays presents for follow-up on depression. She saw her psychologist earlier this week and it has been recommended that she take some time off for work and complete FMLA paperwork. When I last saw her she was depressed so she started Trintellix. She took it for 2 weeks and says it didn't cause any side effects and didn't help her much but then when she went to the pharmacy she was told that it was $400 and that's too expensive for her and so she will not take it anymore. She wants to go back to sertraline. She has a very stressful situation at home with her fianc. He is bipolar. She complains of anxiety, crying spells, anhedonia, feeling helpless and feeling hopeless. She has had a decrease in her appetite with an occasional episode of nausea but no vomiting. She denies SI or HI.  Outpatient Medications Prior to Visit  Medication Sig Dispense Refill  . clonazePAM (KLONOPIN) 1 MG tablet Take 1 tablet (1 mg total) by mouth 3 (three) times daily as needed. for anxiety 90 tablet 3  . GIANVI 3-0.02 MG tablet TAKE 1 TABLET DAILY 3 tablet 2  . loratadine (CLARITIN) 10 MG tablet Take 10 mg by mouth as needed.     . Multiple Vitamin (MULTIVITAMIN) tablet Take 1 tablet by mouth daily.      . Omega-3 Fatty Acids (OMEGA 3 PO) Take by mouth.    Marland Kitchen b complex vitamins tablet Take 1 tablet by mouth daily.    Marland Kitchen vortioxetine HBr (TRINTELLIX) 20 MG TABS Take 20 mg by mouth daily. (Patient not taking: Reported on 08/22/2016) 30 tablet 11   No facility-administered medications prior to visit.     ROS Review of Systems  Constitutional: Positive for appetite change, fatigue and unexpected weight change. Negative for activity change and diaphoresis.  HENT: Negative.   Eyes: Negative.   Respiratory: Negative.   Gastrointestinal: Positive for nausea. Negative for abdominal pain,  constipation, diarrhea and vomiting.  Endocrine: Negative.   Genitourinary: Negative.   Musculoskeletal: Negative.  Negative for arthralgias, back pain, joint swelling, myalgias and neck pain.  Skin: Negative.  Negative for color change and rash.  Allergic/Immunologic: Negative.   Neurological: Negative.  Negative for dizziness and headaches.  Hematological: Negative.  Negative for adenopathy. Does not bruise/bleed easily.  Psychiatric/Behavioral: Positive for dysphoric mood and sleep disturbance. Negative for agitation, behavioral problems, confusion, decreased concentration, hallucinations, self-injury and suicidal ideas. The patient is nervous/anxious. The patient is not hyperactive.     Objective:  BP 118/78   Pulse 77   Temp 98.5 F (36.9 C) (Oral)   Resp 16   Ht 5\' 6"  (1.676 m)   Wt 134 lb 12.8 oz (61.1 kg)   LMP 06/15/2016   SpO2 98%   BMI 21.76 kg/m   BP Readings from Last 3 Encounters:  08/22/16 118/78  07/24/16 118/80  04/11/16 100/80    Wt Readings from Last 3 Encounters:  08/22/16 134 lb 12.8 oz (61.1 kg)  07/24/16 138 lb 4 oz (62.7 kg)  04/11/16 136 lb (61.7 kg)    Physical Exam  Constitutional: She is oriented to person, place, and time. No distress.  HENT:  Mouth/Throat: No oropharyngeal exudate.  Eyes: Conjunctivae are normal. Right eye exhibits no discharge. Left eye exhibits no discharge. No scleral icterus.  Neck: Normal range of motion.  Neck supple. No JVD present. No tracheal deviation present. No thyromegaly present.  Cardiovascular: Normal rate, regular rhythm, normal heart sounds and intact distal pulses.  Exam reveals no gallop and no friction rub.   No murmur heard. Pulmonary/Chest: Effort normal and breath sounds normal. No stridor. No respiratory distress. She has no wheezes. She has no rales. She exhibits no tenderness.  Abdominal: Soft. Bowel sounds are normal. She exhibits no distension and no mass. There is no tenderness. There is no  rebound and no guarding.  Musculoskeletal: She exhibits no edema or tenderness.  Lymphadenopathy:    She has no cervical adenopathy.  Neurological: She is oriented to person, place, and time.  Skin: Skin is warm and dry. No rash noted. She is not diaphoretic. No erythema.  Psychiatric: Judgment normal. Her affect is not angry. Her speech is delayed. Her speech is not rapid and/or pressured and not tangential. She is slowed and withdrawn. She is not agitated, not aggressive and not actively hallucinating. Cognition and memory are normal. Cognition and memory are not impaired. She exhibits a depressed mood. She expresses no homicidal and no suicidal ideation. She expresses no suicidal plans. She exhibits normal recent memory and normal remote memory.  She is crying throughout the exam She is attentive.    Lab Results  Component Value Date   WBC 7.3 07/06/2016   HGB 13.4 07/06/2016   HCT 43 07/06/2016   PLT 319 07/06/2016   GLUCOSE 83 04/14/2015   CHOL 208 (A) 07/06/2016   TRIG 147 07/06/2016   HDL 72 (A) 07/06/2016   LDLDIRECT 147.9 10/29/2013   LDLCALC 107 07/06/2016   ALT 8 07/06/2016   AST 14 07/06/2016   NA 142 07/06/2016   K 4.6 07/06/2016   CL 103 04/14/2015   CREATININE 0.7 07/06/2016   BUN 14 07/06/2016   CO2 27 04/14/2015   TSH 1.57 07/06/2016    No results found.  Assessment & Plan:   Avnoor was seen today for depression.  Diagnoses and all orders for this visit:  Depression with somatization- I will discontinue Trintllix at her request though I still think it would've been a good antidepressant for her. Will go back to sertraline at 100 mg and she is starting to show signs of psychosis so will add 2 mg of Abilify. I've asked her to stay out of work for the next 3 weeks and will complete FMLA paperwork. I would like for her to return in about 4 weeks to consider dosing adjustments on the meds and she will let me know sooner if she develops any worsening symptoms. -      sertraline (ZOLOFT) 100 MG tablet; Take 1 tablet (100 mg total) by mouth daily. -     ARIPiprazole (ABILIFY) 2 MG tablet; Take 1 tablet (2 mg total) by mouth daily.   I have discontinued Ms. Klopf's multivitamin, b complex vitamins, vortioxetine HBr, and sertraline. I am also having her start on sertraline and ARIPiprazole. Additionally, I am having her maintain her loratadine, GIANVI, Omega-3 Fatty Acids (OMEGA 3 PO), and clonazePAM.  Meds ordered this encounter  Medications  . DISCONTD: sertraline (ZOLOFT) 50 MG tablet    Sig: Take 50 mg by mouth daily.  . sertraline (ZOLOFT) 100 MG tablet    Sig: Take 1 tablet (100 mg total) by mouth daily.    Dispense:  90 tablet    Refill:  1  . ARIPiprazole (ABILIFY) 2 MG tablet    Sig: Take 1 tablet (  2 mg total) by mouth daily.    Dispense:  30 tablet    Refill:  2     Follow-up: Return in about 4 weeks (around 09/19/2016).  Sanda Lingerhomas Jones, MD

## 2016-08-27 ENCOUNTER — Telehealth: Payer: Self-pay | Admitting: Emergency Medicine

## 2016-08-27 NOTE — Telephone Encounter (Signed)
Marijean NiemannJaime, will you please let Ms Birdena JubileeWillprecht know that we have yet to receive her FMLA forms from her employer but we will be happy to complete tham once we do? Thanks!

## 2016-08-27 NOTE — Telephone Encounter (Signed)
I called patient back. She states she handed these forms to Dr Yetta BarreJones. She can get these faxed back over if need be. Thanks.

## 2016-08-27 NOTE — Telephone Encounter (Addendum)
I can call pt if you need me to.

## 2016-08-27 NOTE — Telephone Encounter (Signed)
Pt called to follow up on her FMLA papers. Please give her a call when they are ready thanks.

## 2016-08-27 NOTE — Telephone Encounter (Signed)
I do not have these forms  

## 2016-08-27 NOTE — Telephone Encounter (Signed)
Dr Yetta BarreJones' said he didn't recall receiving these forms at her recent OV, have you seen these Stef

## 2016-08-28 NOTE — Telephone Encounter (Signed)
Pt FMLA form has arrived. To PCP for signature.

## 2016-08-28 NOTE — Telephone Encounter (Addendum)
Pt called back informed her that nobody has seen the forms. She insist that we ask Dr Yetta BarreJones to check for those forms again. She asked her work to fax those forms over again but there was a note from her counselor in there that she cant get another copy of. Please advise thanks.

## 2016-08-30 NOTE — Telephone Encounter (Signed)
Left vm for pt to call back. Need to let pt know that Raelyn NumberSteffannie has paper work, we will get it done by Friday.

## 2016-08-30 NOTE — Telephone Encounter (Signed)
Pt called to check up on FMLA that was suppose to be fax over to her work place on 08/28/16(they do not have the form). Please check on this and call her back ASAP. Pt does not want to get in trouble with her work place.   # [phone]

## 2016-08-30 NOTE — Telephone Encounter (Signed)
Pt aware. Please call pt once we fax it on Friday.

## 2016-08-30 NOTE — Telephone Encounter (Signed)
Informed pt that form should be done on Friday.

## 2016-09-01 ENCOUNTER — Telehealth: Payer: Self-pay

## 2016-09-01 NOTE — Telephone Encounter (Signed)
Error

## 2016-09-01 NOTE — Telephone Encounter (Signed)
Pt informed FMLA and return to work form has been faxed. Copy sent to chart. Copy sent to charge. Original is up front for pt records to be picked up at next appt.

## 2016-09-06 ENCOUNTER — Encounter: Payer: Self-pay | Admitting: Nurse Practitioner

## 2016-09-06 ENCOUNTER — Other Ambulatory Visit: Payer: BLUE CROSS/BLUE SHIELD

## 2016-09-06 ENCOUNTER — Ambulatory Visit (INDEPENDENT_AMBULATORY_CARE_PROVIDER_SITE_OTHER): Payer: BLUE CROSS/BLUE SHIELD | Admitting: Nurse Practitioner

## 2016-09-06 VITALS — BP 128/88 | HR 77 | Temp 97.7°F | Ht 66.0 in | Wt 136.0 lb

## 2016-09-06 DIAGNOSIS — F450 Somatization disorder: Secondary | ICD-10-CM | POA: Diagnosis not present

## 2016-09-06 DIAGNOSIS — Z7251 High risk heterosexual behavior: Secondary | ICD-10-CM

## 2016-09-06 DIAGNOSIS — L309 Dermatitis, unspecified: Secondary | ICD-10-CM

## 2016-09-06 DIAGNOSIS — F329 Major depressive disorder, single episode, unspecified: Secondary | ICD-10-CM | POA: Diagnosis not present

## 2016-09-06 DIAGNOSIS — Z23 Encounter for immunization: Secondary | ICD-10-CM

## 2016-09-06 DIAGNOSIS — F32A Depression, unspecified: Secondary | ICD-10-CM

## 2016-09-06 DIAGNOSIS — F411 Generalized anxiety disorder: Secondary | ICD-10-CM

## 2016-09-06 LAB — HIV ANTIBODY (ROUTINE TESTING W REFLEX): HIV 1&2 Ab, 4th Generation: NONREACTIVE

## 2016-09-06 MED ORDER — CLINDAMYCIN PHOSPHATE 1 % EX LOTN: TOPICAL_LOTION | Freq: Two times a day (BID) | CUTANEOUS | 0 refills | Status: DC

## 2016-09-06 MED ORDER — ARIPIPRAZOLE 5 MG PO TABS: 5.0000 mg | ORAL_TABLET | Freq: Every day | ORAL | 2 refills | Status: DC

## 2016-09-06 MED ORDER — DESONIDE 0.05 % EX OINT: 1.0000 "application " | TOPICAL_OINTMENT | Freq: Two times a day (BID) | CUTANEOUS | 0 refills | Status: DC

## 2016-09-06 NOTE — Telephone Encounter (Signed)
Pt sent additional paper work on 09/03/2016. PCP will return to the office on 09/11/2016. [facility] contacted and given same information. Pt contacted with same information.

## 2016-09-06 NOTE — Progress Notes (Signed)
Normal results, see office note

## 2016-09-06 NOTE — Progress Notes (Signed)
Pre visit review using our clinic review tool, if applicable. No additional management support is needed unless otherwise documented below in the visit note. 

## 2016-09-06 NOTE — Progress Notes (Signed)
Subjective:  Patient ID: Erin Mays, female    DOB: [birth year]  Age: 35 y.o. MRN: [id number]  CC: Rash (Pt stated having dry skin, redness on the nose/chin for about 5 days); Depression ( Pt anxiety/crying almost everyday. ); and Exposure to STD (unprotected sex with old friend)   Rash  This is a new problem. The current episode started in the past 7 days. The problem is unchanged. The affected locations include the face (around nose and chin). The rash is characterized by itchiness, dryness, redness and scaling. She was exposed to nothing. Pertinent negatives include no anorexia, congestion, cough, facial edema, fatigue, fever, joint pain or sore throat. Past treatments include nothing. There is no history of eczema or varicella.  Depression       The patient presents with depression.(Denies any symptoms at this time)  This is a chronic problem.  The current episode started more than 1 year ago.   The problem occurs constantly.The problem is unchanged.  Associated symptoms include helplessness, hopelessness, appetite change, myalgias and sad.  Associated symptoms include no fatigue, does not have insomnia, not irritable and no suicidal ideas.  Past treatments include psychotherapy, other medications, SSRIs - Selective serotonin reuptake inhibitors and SNRIs - Serotonin and norepinephrine reuptake inhibitors (trintillenx, abilify and zoloft).  Compliance with treatment is good.  Past compliance problems include insurance issues.  Risk factors include a change in medication usage/dosage and a change in medications.   Past medical history includes depression.     Pertinent negatives include no recent illness, no life-threatening condition, no physical disability and no suicide attempts. Exposure to STD   Primary symptoms comment: denies any symptoms at this time. The problem has been unchanged. The vaginal discharge was normal. Pertinent negatives include no abdominal pain, anorexia, diaphoresis,  fever, genital odor, rectal pain, sore throat or urinary frequency. Risk factors include multiple sexual partners (had unprotected sex with old friend. also sexually active with husband.).  states she was drinking heavily when had sex with friend. She denies any abuse. States she feels safe at home. She denies any previous episodes of impulsive behavior.  Outpatient Medications Prior to Visit  Medication Sig Dispense Refill  . clonazePAM (KLONOPIN) 1 MG tablet Take 1 tablet (1 mg total) by mouth 3 (three) times daily as needed. for anxiety 90 tablet 3  . GIANVI 3-0.02 MG tablet TAKE 1 TABLET DAILY 3 tablet 2  . loratadine (CLARITIN) 10 MG tablet Take 10 mg by mouth as needed.     . Omega-3 Fatty Acids (OMEGA 3 PO) Take by mouth.    . sertraline (ZOLOFT) 100 MG tablet Take 1 tablet (100 mg total) by mouth daily. 90 tablet 1  . ARIPiprazole (ABILIFY) 2 MG tablet Take 1 tablet (2 mg total) by mouth daily. 30 tablet 2   No facility-administered medications prior to visit.     ROS See HPI  Objective:  BP 128/88 (BP Location: Left Arm, Patient Position: Sitting, Cuff Size: Normal)   Pulse 77   Temp 97.7 F (36.5 C)   Ht 5\' 6"  (1.676 m)   Wt 136 lb (61.7 kg)   SpO2 99%   BMI 21.95 kg/m   BP Readings from Last 3 Encounters:  09/06/16 128/88  08/22/16 118/78  07/24/16 118/80    Wt Readings from Last 3 Encounters:  09/06/16 136 lb (61.7 kg)  08/22/16 134 lb 12.8 oz (61.1 kg)  07/24/16 138 lb 4 oz (62.7 kg)    Physical Exam  Constitutional: She is oriented to person, place, and time. She is not irritable.  Eyes: No scleral icterus.  Neck: Normal range of motion. Neck supple. No thyromegaly present.  Cardiovascular: Normal rate, regular rhythm and normal heart sounds.   Pulmonary/Chest: Effort normal and breath sounds normal.  Abdominal: Soft.  Musculoskeletal: She exhibits no edema.  Lymphadenopathy:    She has no cervical adenopathy.  Neurological: She is alert and oriented  to person, place, and time.  Skin: Skin is warm and dry. Rash noted. Rash is macular. There is erythema.     Psychiatric: Her behavior is normal. Thought content normal.  Intermittently tearful during office visit.  Vitals reviewed.   Lab Results  Component Value Date   WBC 7.3 07/06/2016   HGB 13.4 07/06/2016   HCT 43 07/06/2016   PLT 319 07/06/2016   GLUCOSE 83 04/14/2015   CHOL 208 (A) 07/06/2016   TRIG 147 07/06/2016   HDL 72 (A) 07/06/2016   LDLDIRECT 147.9 10/29/2013   LDLCALC 107 07/06/2016   ALT 8 07/06/2016   AST 14 07/06/2016   NA 142 07/06/2016   K 4.6 07/06/2016   CL 103 04/14/2015   CREATININE 0.7 07/06/2016   BUN 14 07/06/2016   CO2 27 04/14/2015   TSH 1.57 07/06/2016    No results found.  Assessment & Plan:   Erin Mays was seen today for rash, depression and exposure to std.  Diagnoses and all orders for this visit:  GAD (generalized anxiety disorder) -     ARIPiprazole (ABILIFY) 5 MG tablet; Take 1 tablet (5 mg total) by mouth daily.  Depression with somatization -     ARIPiprazole (ABILIFY) 5 MG tablet; Take 1 tablet (5 mg total) by mouth daily.  Facial dermatitis -     clindamycin (CLEOCIN T) 1 % lotion; Apply topically 2 (two) times daily. -     desonide (DESOWEN) 0.05 % ointment; Apply 1 application topically 2 (two) times daily.  Unprotected sexual intercourse -     HIV antibody -     RPR -     HSV(herpes smplx)abs-1+2(IgG+IgM)-bld -     GC/chlamydia probe amp, urine; Future   I have discontinued Erin Mays's ARIPiprazole. I am also having her start on ARIPiprazole, clindamycin, and desonide. Additionally, I am having her maintain her loratadine, GIANVI, Omega-3 Fatty Acids (OMEGA 3 PO), clonazePAM, and sertraline.  Meds ordered this encounter  Medications  . ARIPiprazole (ABILIFY) 5 MG tablet    Sig: Take 1 tablet (5 mg total) by mouth daily.    Dispense:  30 tablet    Refill:  2    Order Specific Question:   Supervising  Provider    Answer:   Tresa Garter [[id number]]  . clindamycin (CLEOCIN T) 1 % lotion    Sig: Apply topically 2 (two) times daily.    Dispense:  60 mL    Refill:  0    Order Specific Question:   Supervising Provider    Answer:   Tresa Garter [[id number]]  . desonide (DESOWEN) 0.05 % ointment    Sig: Apply 1 application topically 2 (two) times daily.    Dispense:  15 g    Refill:  0    Order Specific Question:   Supervising Provider    Answer:   Tresa Garter [[id number]]    Follow-up: Return in about 1 month (around 10/06/2016) for Depression with Dr. Yetta Barre.  Alysia Penna, NP

## 2016-09-06 NOTE — Patient Instructions (Addendum)
Rash could be rosacea vs impetigo?  Strongly recommended to avoid use of ETOH Continue therapy with pschologist. Use Mobile crisis number or go to ED if symptoms worsen.  We discussed common side effects such as nausea, drowsiness and weight gain.   Also discussed rare but serious side effect of suicide ideation.  She is instructed to discontinue medication go directly to ED if this occurs.  Pt verbalizes understanding.   Plan follow up in 1 month to evaluate progress.

## 2016-09-07 LAB — GC/CHLAMYDIA PROBE AMP
CT PROBE, AMP APTIMA: NOT DETECTED
GC PROBE AMP APTIMA: NOT DETECTED

## 2016-09-07 LAB — HSV(HERPES SMPLX)ABS-I+II(IGG+IGM)-BLD
HSV 1 GLYCOPROTEIN G AB, IGG: 3.09 {index} — AB (ref <0.90)
HSV 2 Glycoprotein G Ab, IgG: 10.5 Index — ABNORMAL HIGH (ref <0.90)
Herpes Simplex Vrs I&II-IgM Ab (EIA): 1.29 INDEX — ABNORMAL HIGH

## 2016-09-07 LAB — RPR

## 2016-09-07 NOTE — Progress Notes (Signed)
Normal results, see office note

## 2016-09-10 NOTE — Telephone Encounter (Signed)
Pt called today. Stated that she is moving to [place] with mom.  Pt informed that PCP will only be able to cover information while she is still under his care. Pt informed that she will need to reestablish with new provider in [place].

## 2016-09-11 NOTE — Telephone Encounter (Signed)
Faxed fmla to [employer]

## 2016-09-12 NOTE — Telephone Encounter (Signed)
Form faxed to [facility]

## 2016-09-14 ENCOUNTER — Telehealth: Payer: Self-pay

## 2016-09-14 DIAGNOSIS — F409 Phobic anxiety disorder, unspecified: Secondary | ICD-10-CM

## 2016-09-14 DIAGNOSIS — F32A Depression, unspecified: Secondary | ICD-10-CM

## 2016-09-14 DIAGNOSIS — F450 Somatization disorder: Principal | ICD-10-CM

## 2016-09-14 DIAGNOSIS — F329 Major depressive disorder, single episode, unspecified: Secondary | ICD-10-CM

## 2016-09-14 DIAGNOSIS — F411 Generalized anxiety disorder: Secondary | ICD-10-CM

## 2016-09-14 DIAGNOSIS — F5105 Insomnia due to other mental disorder: Secondary | ICD-10-CM

## 2016-09-14 MED ORDER — ARIPIPRAZOLE 5 MG PO TABS: 5.0000 mg | ORAL_TABLET | Freq: Every day | ORAL | 0 refills | Status: DC

## 2016-09-14 MED ORDER — CLONAZEPAM 1 MG PO TABS: 1.0000 mg | ORAL_TABLET | Freq: Three times a day (TID) | ORAL | 0 refills | Status: DC | PRN

## 2016-09-14 MED ORDER — SERTRALINE HCL 100 MG PO TABS: 100.0000 mg | ORAL_TABLET | Freq: Every day | ORAL | 0 refills | Status: DC

## 2016-09-14 NOTE — Telephone Encounter (Signed)
Pt came in today to pick up rx and copy of fmla paper work.  Verbal okay from [facility] to print rx for pt to take with her to [place]. Pt is moving on Saturday and wanted to be able to take printed rx's with her to get her through until she gets reestablished with a new provider.   Called [facility] and canceled all rx refills for the Abilify, Clonazepam and Sertraline.

## 2017-03-04 ENCOUNTER — Telehealth: Payer: Self-pay | Admitting: Internal Medicine

## 2017-03-04 NOTE — Telephone Encounter (Signed)
Pt called in and said that she is flying on thurs and would like to know if Dr Yetta Barrejones would like in a small script of of xanex for her

## 2017-11-26 ENCOUNTER — Ambulatory Visit: Payer: BLUE CROSS/BLUE SHIELD | Admitting: Internal Medicine

## 2017-11-29 ENCOUNTER — Other Ambulatory Visit (INDEPENDENT_AMBULATORY_CARE_PROVIDER_SITE_OTHER): Payer: BLUE CROSS/BLUE SHIELD

## 2017-11-29 ENCOUNTER — Encounter: Payer: Self-pay | Admitting: Internal Medicine

## 2017-11-29 ENCOUNTER — Ambulatory Visit (INDEPENDENT_AMBULATORY_CARE_PROVIDER_SITE_OTHER): Payer: BLUE CROSS/BLUE SHIELD | Admitting: Internal Medicine

## 2017-11-29 VITALS — BP 110/80 | HR 84 | Temp 99.3°F | Resp 16 | Ht 66.0 in | Wt 148.2 lb

## 2017-11-29 DIAGNOSIS — F32A Depression, unspecified: Secondary | ICD-10-CM

## 2017-11-29 DIAGNOSIS — E785 Hyperlipidemia, unspecified: Secondary | ICD-10-CM | POA: Diagnosis not present

## 2017-11-29 DIAGNOSIS — F409 Phobic anxiety disorder, unspecified: Secondary | ICD-10-CM

## 2017-11-29 DIAGNOSIS — Z0000 Encounter for general adult medical examination without abnormal findings: Secondary | ICD-10-CM

## 2017-11-29 DIAGNOSIS — F411 Generalized anxiety disorder: Secondary | ICD-10-CM | POA: Diagnosis not present

## 2017-11-29 DIAGNOSIS — F450 Somatization disorder: Secondary | ICD-10-CM

## 2017-11-29 DIAGNOSIS — F418 Other specified anxiety disorders: Secondary | ICD-10-CM | POA: Diagnosis not present

## 2017-11-29 DIAGNOSIS — F5105 Insomnia due to other mental disorder: Secondary | ICD-10-CM

## 2017-11-29 DIAGNOSIS — F329 Major depressive disorder, single episode, unspecified: Secondary | ICD-10-CM

## 2017-11-29 LAB — THYROID PANEL WITH TSH
Free Thyroxine Index: 1.8 (ref 1.4–3.8)
T3 UPTAKE: 31 % (ref 22–35)
T4 TOTAL: 5.9 ug/dL (ref 5.1–11.9)
TSH: 1.7 m[IU]/L

## 2017-11-29 LAB — LIPID PANEL
Cholesterol: 216 mg/dL — ABNORMAL HIGH (ref 0–200)
HDL: 82.6 mg/dL
LDL Cholesterol: 116 mg/dL — ABNORMAL HIGH (ref 0–99)
NonHDL: 133.29
Total CHOL/HDL Ratio: 3
Triglycerides: 85 mg/dL (ref 0.0–149.0)
VLDL: 17 mg/dL (ref 0.0–40.0)

## 2017-11-29 MED ORDER — CLONAZEPAM 1 MG PO TABS: 1.0000 mg | ORAL_TABLET | Freq: Three times a day (TID) | ORAL | 2 refills | Status: DC | PRN

## 2017-11-29 MED ORDER — SERTRALINE HCL 100 MG PO TABS: 100.0000 mg | ORAL_TABLET | Freq: Every day | ORAL | 1 refills | Status: DC

## 2017-11-29 NOTE — Patient Instructions (Signed)
Preventive Care 18-39 Years, Female Preventive care refers to lifestyle choices and visits with your health care provider that can promote health and wellness. What does preventive care include?  A yearly physical exam. This is also called an annual well check.  Dental exams once or twice a year.  Routine eye exams. Ask your health care provider how often you should have your eyes checked.  Personal lifestyle choices, including: ? Daily care of your teeth and gums. ? Regular physical activity. ? Eating a healthy diet. ? Avoiding tobacco and drug use. ? Limiting alcohol use. ? Practicing safe sex. ? Taking vitamin and mineral supplements as recommended by your health care provider. What happens during an annual well check? The services and screenings done by your health care provider during your annual well check will depend on your age, overall health, lifestyle risk factors, and family history of disease. Counseling Your health care provider may ask you questions about your:  Alcohol use.  Tobacco use.  Drug use.  Emotional well-being.  Home and relationship well-being.  Sexual activity.  Eating habits.  Work and work Statistician.  Method of birth control.  Menstrual cycle.  Pregnancy history.  Screening You may have the following tests or measurements:  Height, weight, and BMI.  Diabetes screening. This is done by checking your blood sugar (glucose) after you have not eaten for a while (fasting).  Blood pressure.  Lipid and cholesterol levels. These may be checked every 5 years starting at age 66.  Skin check.  Hepatitis C blood test.  Hepatitis B blood test.  Sexually transmitted disease (STD) testing.  BRCA-related cancer screening. This may be done if you have a family history of breast, ovarian, tubal, or peritoneal cancers.  Pelvic exam and Pap test. This may be done every 3 years starting at age 40. Starting at age 59, this may be done every 5  years if you have a Pap test in combination with an HPV test.  Discuss your test results, treatment options, and if necessary, the need for more tests with your health care provider. Vaccines Your health care provider may recommend certain vaccines, such as:  Influenza vaccine. This is recommended every year.  Tetanus, diphtheria, and acellular pertussis (Tdap, Td) vaccine. You may need a Td booster every 10 years.  Varicella vaccine. You may need this if you have not been vaccinated.  HPV vaccine. If you are 69 or younger, you may need three doses over 6 months.  Measles, mumps, and rubella (MMR) vaccine. You may need at least one dose of MMR. You may also need a second dose.  Pneumococcal 13-valent conjugate (PCV13) vaccine. You may need this if you have certain conditions and were not previously vaccinated.  Pneumococcal polysaccharide (PPSV23) vaccine. You may need one or two doses if you smoke cigarettes or if you have certain conditions.  Meningococcal vaccine. One dose is recommended if you are age 27-21 years and a first-year college student living in a residence hall, or if you have one of several medical conditions. You may also need additional booster doses.  Hepatitis A vaccine. You may need this if you have certain conditions or if you travel or work in places where you may be exposed to hepatitis A.  Hepatitis B vaccine. You may need this if you have certain conditions or if you travel or work in places where you may be exposed to hepatitis B.  Haemophilus influenzae type b (Hib) vaccine. You may need this if  you have certain risk factors.  Talk to your health care provider about which screenings and vaccines you need and how often you need them. This information is not intended to replace advice given to you by your health care provider. Make sure you discuss any questions you have with your health care provider. Document Released: 01/29/2002 Document Revised: 08/22/2016  Document Reviewed: 10/04/2015 Elsevier Interactive Patient Education  2017 [employer].

## 2017-11-29 NOTE — Progress Notes (Signed)
Subjective:  Patient ID: Erin Mays, female    DOB: 03-12-1981  Age: 36 y.o. MRN: [id number]  CC: Depression and Annual Exam   HPI Erin Mays presents for a CPX.  She is being treated for depression and anxiety with clonazepam and sertraline.  She wants to continue those at the current dose.  The last time I saw her was about a year and a half ago when she was being treated for depression with psychotic features.  She was previously treated with Abilify but she has stopped taking it.  She now tells me that she is doing well.  She only takes the clonazepam about 3 times a week.  The prior stressor of living with her boyfriend is much improved as he is doing much better psychologically.  Outpatient Medications Prior to Visit  Medication Sig Dispense Refill  . drospirenone-ethinyl estradiol (YASMIN,ZARAH,SYEDA) 3-0.03 MG tablet Take 1 tablet by mouth daily.    . clonazePAM (KLONOPIN) 1 MG tablet Take 1 tablet (1 mg total) by mouth 3 (three) times daily as needed. for anxiety 90 tablet 0  . sertraline (ZOLOFT) 100 MG tablet Take 1 tablet (100 mg total) by mouth daily. 90 tablet 0  . loratadine (CLARITIN) 10 MG tablet Take 10 mg by mouth as needed.     . ARIPiprazole (ABILIFY) 5 MG tablet Take 1 tablet (5 mg total) by mouth daily. 90 tablet 0  . clindamycin (CLEOCIN T) 1 % lotion Apply topically 2 (two) times daily. 60 mL 0  . desonide (DESOWEN) 0.05 % ointment Apply 1 application topically 2 (two) times daily. 15 g 0  . GIANVI 3-0.02 MG tablet TAKE 1 TABLET DAILY 3 tablet 2  . Omega-3 Fatty Acids (OMEGA 3 PO) Take by mouth.     No facility-administered medications prior to visit.     ROS Review of Systems  Constitutional: Negative.  Negative for diaphoresis, fatigue and unexpected weight change.  HENT: Negative.   Eyes: Negative for visual disturbance.  Respiratory: Negative for cough, chest tightness, shortness of breath and wheezing.   Cardiovascular: Negative for  chest pain, palpitations and leg swelling.  Gastrointestinal: Negative for abdominal pain, constipation, diarrhea, nausea and vomiting.  Endocrine: Negative.   Genitourinary: Negative.  Negative for difficulty urinating.  Musculoskeletal: Negative.   Skin: Negative.   Allergic/Immunologic: Negative.   Neurological: Negative.  Negative for dizziness, weakness and light-headedness.  Hematological: Negative for adenopathy. Does not bruise/bleed easily.  Psychiatric/Behavioral: Positive for dysphoric mood. Negative for behavioral problems, confusion, hallucinations, sleep disturbance and suicidal ideas. The patient is nervous/anxious. The patient is not hyperactive.     Objective:  BP 110/80 (BP Location: Left Arm, Patient Position: Sitting, Cuff Size: Normal)   Pulse 84   Temp 99.3 F (37.4 C) (Oral)   Resp 16   Ht 5\' 6"  (1.676 m)   Wt 148 lb 4 oz (67.2 kg)   LMP 11/22/2017 (Approximate)   SpO2 98%   BMI 23.93 kg/m   BP Readings from Last 3 Encounters:  11/29/17 110/80  09/06/16 128/88  08/22/16 118/78    Wt Readings from Last 3 Encounters:  11/29/17 148 lb 4 oz (67.2 kg)  09/06/16 136 lb (61.7 kg)  08/22/16 134 lb 12.8 oz (61.1 kg)    Physical Exam  Constitutional: She is oriented to person, place, and time. No distress.  HENT:  Mouth/Throat: Oropharynx is clear and moist. No oropharyngeal exudate.  Eyes: Conjunctivae are normal. Left eye exhibits no discharge. No scleral icterus.  Neck: Normal range of motion. Neck supple. No JVD present. No thyromegaly present.  Cardiovascular: Normal rate, regular rhythm, normal heart sounds and intact distal pulses.  No murmur heard. Pulmonary/Chest: Effort normal and breath sounds normal. No respiratory distress. She has no wheezes. She has no rales.  Abdominal: Soft. Bowel sounds are normal. She exhibits no distension and no mass. There is no tenderness. There is no guarding.  Musculoskeletal: Normal range of motion. She exhibits no  edema or tenderness.  Lymphadenopathy:    She has no cervical adenopathy.  Neurological: She is alert and oriented to person, place, and time.  Skin: Skin is warm and dry. No rash noted. She is not diaphoretic. No erythema. No pallor.  Psychiatric: She has a normal mood and affect. Her behavior is normal. Judgment and thought content normal.  Vitals reviewed.   Lab Results  Component Value Date   WBC 7.3 07/06/2016   HGB 13.4 07/06/2016   HCT 43 07/06/2016   PLT 319 07/06/2016   GLUCOSE 83 04/14/2015   CHOL 216 (H) 11/29/2017   TRIG 85.0 11/29/2017   HDL 82.60 11/29/2017   LDLDIRECT 147.9 10/29/2013   LDLCALC 116 (H) 11/29/2017   ALT 8 07/06/2016   AST 14 07/06/2016   NA 142 07/06/2016   K 4.6 07/06/2016   CL 103 04/14/2015   CREATININE 0.7 07/06/2016   BUN 14 07/06/2016   CO2 27 04/14/2015   TSH 1.70 11/29/2017    No results found.  Assessment & Plan:   Erin Mays was seen today for depression and annual exam.  Diagnoses and all orders for this visit:  GAD (generalized anxiety disorder)- as below -     clonazePAM (KLONOPIN) 1 MG tablet; Take 1 tablet (1 mg total) by mouth 3 (three) times daily as needed. for anxiety  Depression with anxiety- She is doing well and has a stable mood on the current combination of clonazepam and sertraline.  Will continue with the current doses. -     clonazePAM (KLONOPIN) 1 MG tablet; Take 1 tablet (1 mg total) by mouth 3 (three) times daily as needed. for anxiety -     sertraline (ZOLOFT) 100 MG tablet; Take 1 tablet (100 mg total) by mouth daily.  Routine general medical examination at a health care facility- exam completed, labs reviewed, vaccines reviewed, Pap smear is up-to-date, patient education material was given. -     Lipid panel; Future  Insomnia due to anxiety and fear  Depression with somatization  Hyperlipidemia with target LDL less than 160- statin therapy is not indicated as she has achieved her LDL goal. -      Thyroid Panel With TSH; Future   I have discontinued Erin Mays's GIANVI, Omega-3 Fatty Acids (OMEGA 3 PO), clindamycin, desonide, and ARIPiprazole. I am also having her maintain her loratadine, drospirenone-ethinyl estradiol, clonazePAM, and sertraline.  Meds ordered this encounter  Medications  . clonazePAM (KLONOPIN) 1 MG tablet    Sig: Take 1 tablet (1 mg total) by mouth 3 (three) times daily as needed. for anxiety    Dispense:  90 tablet    Refill:  2  . sertraline (ZOLOFT) 100 MG tablet    Sig: Take 1 tablet (100 mg total) by mouth daily.    Dispense:  90 tablet    Refill:  1     Follow-up: Return in about 6 months (around 05/30/2018).  Sanda Lingerhomas Delshon Blanchfield, MD

## 2017-11-30 ENCOUNTER — Encounter: Payer: Self-pay | Admitting: Internal Medicine

## 2018-06-05 ENCOUNTER — Telehealth: Payer: Self-pay

## 2018-06-05 NOTE — Telephone Encounter (Signed)
Copied from CRM [id number]. Topic: General - Other >> Jun 05, 2018  9:50 AM Gerrianne ScalePayne, Angela L wrote: Reason for CRM: pt calling stating that [place] be [place]  in [place] and would like an antibiotic for her trip she states that its required for her to get an antibiotic the pharmacy to send to is the    [id number] [place] [place] - [address]      >> Jun 05, 2018  9:58 AM Marquis Buggyverman, Brittany A wrote: Would you like for patient to set up an appointment?

## 2018-06-05 NOTE — Telephone Encounter (Signed)
Pt is traveling out of country and is requesting an abx to take in case of GI infection   Please advise if you are okay with rx'ing.

## 2018-06-09 ENCOUNTER — Other Ambulatory Visit: Payer: Self-pay | Admitting: Internal Medicine

## 2018-06-09 DIAGNOSIS — A09 Infectious gastroenteritis and colitis, unspecified: Secondary | ICD-10-CM

## 2018-06-09 MED ORDER — CIPROFLOXACIN HCL 500 MG PO TABS: 500.0000 mg | ORAL_TABLET | Freq: Two times a day (BID) | ORAL | 0 refills | Status: AC

## 2018-06-09 NOTE — Telephone Encounter (Signed)
done

## 2018-06-09 NOTE — Telephone Encounter (Signed)
Left detailed message that rx has been sent in as requested.

## 2018-06-14 ENCOUNTER — Other Ambulatory Visit: Payer: Self-pay | Admitting: Internal Medicine

## 2018-06-14 DIAGNOSIS — F418 Other specified anxiety disorders: Secondary | ICD-10-CM

## 2018-06-30 ENCOUNTER — Telehealth: Payer: Self-pay | Admitting: Internal Medicine

## 2018-06-30 NOTE — Telephone Encounter (Signed)
Patient spoke with team health on 7/13 at 12:35pm.  Patient states she has not had her period in 2 weeks.  States home testing shows she is not pregnant but patient believes there could be a chance she is.  Patient was advised to call back to schedule an appointment if this is an ongoing problem for the next 2 weeks.

## 2018-07-03 ENCOUNTER — Ambulatory Visit: Payer: BLUE CROSS/BLUE SHIELD | Admitting: Family

## 2018-08-28 NOTE — Progress Notes (Signed)
Erin Mays - 37 y.o. female MRN [id number]  Date of birth: 02-Nov-1981  SUBJECTIVE:  Including CC & ROS.  Chief Complaint  Patient presents with  . Hand Pain    Erin Mays is a 37 y.o. female that is presenting with right middle finger pain. [place]. Pain is located at her PIP joint of the middle finger. The incident occurred about 5 weeks ago. She doesn't feel that it has improved at all. Swelling is present. Mild to severe pain bending her finger. Difficulty during flexion. She has been applying ice and taking advil.    Review of Systems  Constitutional: Negative for fever.  HENT: Negative for congestion.   Respiratory: Negative for cough.   Cardiovascular: Negative for chest pain.  Gastrointestinal: Negative for abdominal pain.  Musculoskeletal: Positive for joint swelling.  Skin: Negative for color change.  Neurological: Negative for weakness.  Hematological: Negative for adenopathy.  Psychiatric/Behavioral: Negative for agitation.    HISTORY: Past Medical, Surgical, Social, and Family History Reviewed & Updated per EMR.   Pertinent Historical Findings include:  Past Medical History:  Diagnosis Date  . Allergy   . Anxiety   . Depression   . Headache   . Hyperlipidemia     Past Surgical History:  Procedure Laterality Date  . APPENDECTOMY      No Known Allergies  Family History  Problem Relation Age of Onset  . Diabetes Maternal Aunt        breast cancer- great aunt  . Diabetes Maternal Grandfather   . Depression Other   . Hyperlipidemia Other   . Hypertension Other   . Hypertension Mother   . Cancer Paternal Grandmother        GIST  . Depression Sister   . Depression Brother      Social History   Socioeconomic History  . Marital status: Single    Spouse name: Not on file  . Number of children: Not on file  . Years of education: Not on file  . Highest education level: Not  on file  Occupational History  . Occupation: Training and development officer    Comment: [employer]  Social Needs  . Financial resource strain: Not on file  . Food insecurity:    Worry: Not on file    Inability: Not on file  . Transportation needs:    Medical: Not on file    Non-medical: Not on file  Tobacco Use  . Smoking status: Never Smoker  . Smokeless tobacco: Never Used  Substance and Sexual Activity  . Alcohol use: Yes    Alcohol/week: 3.0 standard drinks    Types: 3 Glasses of wine per week  . Drug use: No  . Sexual activity: Yes    Partners: Male    Birth control/protection: Pill  Lifestyle  . Physical activity:    Days per week: Not on file    Minutes per session: Not on file  . Stress: Not on file  Relationships  . Social connections:    Talks on phone: Not on file    Gets together: Not on file    Attends religious service: Not on file    Active member of club or organization: Not on file    Attends meetings of clubs or organizations: Not on file    Relationship status: Not on file  . Intimate partner violence:    Fear of current or ex partner: Not on  file    Emotionally abused: Not on file    Physically abused: Not on file    Forced sexual activity: Not on file  Other Topics Concern  . Not on file  Social History Narrative   Domestic Partner   Pt is a Multimedia programmervegitarian   Regular exercise-Yes     PHYSICAL EXAM:  VS: BP 128/74   Pulse 68   Ht 5\' 6"  (1.676 m)   Wt 146 lb (66.2 kg)   LMP 08/05/2018   SpO2 98%   BMI 23.57 kg/m  Physical Exam Gen: NAD, alert, cooperative with exam, well-appearing ENT: normal lips, normal nasal mucosa,  Eye: normal EOM, normal conjunctiva and lids CV:  no edema, +2 pedal pulses   Resp: no accessory muscle use, non-labored,  Skin: no rashes, no areas of induration  Neuro: normal tone, normal sensation to touch Psych:  normal insight, alert and oriented MSK:  Right hand:  Obvious swelling of the PIP joint of the middle finger.  Normal  extension of the middle finger.  Limited flexion of the middle finger.  Normal finger alignment and no malrotation.   No TTP of the MT  Normal pincer grasp  Neurovascularly intact   Limited ultrasound: Right middle finger:  Dynamic testing reveals that there appears to be excessive translation with ulnar and radial deviation. mild effusion of the PIP joint. No obvious bony defects or lesion.  Summary: Findings suggestive of the volar plate  Ultrasound and interpretation by Clare GandyJeremy Khloe Hunkele, MD      ASSESSMENT & PLAN:   Pain of right middle finger Findings suggestive of a volar plate injury to the PIP joint.  Flexor tendon is intact even though she has limited flexion.  No malrotation or misalignment. -X-ray today. -Buddy taping -Counseled on home exercise therapy. -Counseled on supportive care. -Follow-up in 2 to 3 weeks.  If no improvement will refer to physical therapy or consider an MRI

## 2018-08-29 ENCOUNTER — Telehealth: Payer: Self-pay | Admitting: Family Medicine

## 2018-08-29 ENCOUNTER — Ambulatory Visit (INDEPENDENT_AMBULATORY_CARE_PROVIDER_SITE_OTHER)
Admission: RE | Admit: 2018-08-29 | Discharge: 2018-08-29 | Disposition: A | Discharge status: Home / Self Care | Payer: BLUE CROSS/BLUE SHIELD | Source: Ambulatory Visit | Attending: Family Medicine | Admitting: Family Medicine

## 2018-08-29 ENCOUNTER — Ambulatory Visit (INDEPENDENT_AMBULATORY_CARE_PROVIDER_SITE_OTHER): Payer: BLUE CROSS/BLUE SHIELD | Admitting: Family Medicine

## 2018-08-29 ENCOUNTER — Encounter: Payer: Self-pay | Admitting: Family Medicine

## 2018-08-29 ENCOUNTER — Other Ambulatory Visit: Payer: Self-pay | Admitting: Family Medicine

## 2018-08-29 ENCOUNTER — Ambulatory Visit: Payer: Self-pay

## 2018-08-29 VITALS — BP 128/74 | HR 68 | Ht 66.0 in | Wt 146.0 lb

## 2018-08-29 DIAGNOSIS — M79644 Pain in right finger(s): Secondary | ICD-10-CM

## 2018-08-29 DIAGNOSIS — S62632A Displaced fracture of distal phalanx of right middle finger, initial encounter for closed fracture: Secondary | ICD-10-CM | POA: Diagnosis not present

## 2018-08-29 NOTE — Telephone Encounter (Signed)
[facility] finger [facility] results:  IMPRESSION: Apparent nondisplaced fracture distal aspect third middle phalanx. No other evident fracture. No dislocation. Mild soft tissue swelling noted. No appreciable arthropathy.  Will send results [facility] [place].

## 2018-08-29 NOTE — Telephone Encounter (Signed)
Spoke with patient about xrays.   Erin Mays, Erin Desta E, MD [facility] 08/29/2018, 5:37 PM

## 2018-08-29 NOTE — Patient Instructions (Signed)
Nice to meet you  Please try the passive range of motion exercises. Please try these every 1-2 hours at work  Please try buddy taping.  Please follow up with me in 2-3 weeks.

## 2018-08-29 NOTE — Telephone Encounter (Signed)
Informed patient of xrays  Erin Mays, [name], MD [facility] 08/29/2018, 5:38 PM

## 2018-08-31 DIAGNOSIS — M79644 Pain in right finger(s): Secondary | ICD-10-CM | POA: Insufficient documentation

## 2018-08-31 NOTE — Assessment & Plan Note (Signed)
Findings suggestive of a volar plate injury to the PIP joint.  Flexor tendon is intact even though she has limited flexion.  No malrotation or misalignment. -X-ray today. -Buddy taping -Counseled on home exercise therapy. -Counseled on supportive care. -Follow-up in 2 to 3 weeks.  If no improvement will refer to physical therapy or consider an MRI

## 2018-09-01 ENCOUNTER — Encounter: Payer: Self-pay | Admitting: Family Medicine

## 2018-11-28 ENCOUNTER — Other Ambulatory Visit: Payer: Self-pay | Admitting: Internal Medicine

## 2018-11-28 DIAGNOSIS — F418 Other specified anxiety disorders: Secondary | ICD-10-CM

## 2019-02-05 ENCOUNTER — Encounter: Payer: Self-pay | Admitting: Internal Medicine

## 2019-02-05 ENCOUNTER — Ambulatory Visit (INDEPENDENT_AMBULATORY_CARE_PROVIDER_SITE_OTHER)
Admission: RE | Admit: 2019-02-05 | Discharge: 2019-02-05 | Disposition: A | Discharge status: Home / Self Care | Payer: BLUE CROSS/BLUE SHIELD | Source: Ambulatory Visit | Attending: Internal Medicine | Admitting: Internal Medicine

## 2019-02-05 ENCOUNTER — Ambulatory Visit (INDEPENDENT_AMBULATORY_CARE_PROVIDER_SITE_OTHER): Payer: BLUE CROSS/BLUE SHIELD | Admitting: Internal Medicine

## 2019-02-05 VITALS — BP 124/72 | HR 64 | Temp 98.2°F | Resp 16 | Ht 66.0 in | Wt 148.5 lb

## 2019-02-05 DIAGNOSIS — J988 Other specified respiratory disorders: Secondary | ICD-10-CM

## 2019-02-05 DIAGNOSIS — R05 Cough: Secondary | ICD-10-CM | POA: Diagnosis not present

## 2019-02-05 DIAGNOSIS — F411 Generalized anxiety disorder: Secondary | ICD-10-CM

## 2019-02-05 DIAGNOSIS — R059 Cough, unspecified: Secondary | ICD-10-CM | POA: Insufficient documentation

## 2019-02-05 DIAGNOSIS — F418 Other specified anxiety disorders: Secondary | ICD-10-CM

## 2019-02-05 LAB — POCT EXHALED NITRIC OXIDE: FeNO level (ppb): 14

## 2019-02-05 MED ORDER — CLONAZEPAM 1 MG PO TABS: 1.0000 mg | ORAL_TABLET | Freq: Two times a day (BID) | ORAL | 3 refills | Status: DC | PRN

## 2019-02-05 MED ORDER — CEFDINIR 300 MG PO CAPS: 300.0000 mg | ORAL_CAPSULE | Freq: Two times a day (BID) | ORAL | 0 refills | Status: AC

## 2019-02-05 MED ORDER — HYDROCODONE-HOMATROPINE 5-1.5 MG/5ML PO SYRP: 5.0000 mL | ORAL_SOLUTION | Freq: Three times a day (TID) | ORAL | 0 refills | Status: AC | PRN

## 2019-02-05 MED ORDER — HYDROCODONE-HOMATROPINE 5-1.5 MG/5ML PO SYRP: 5.0000 mL | ORAL_SOLUTION | Freq: Three times a day (TID) | ORAL | 0 refills | Status: DC | PRN

## 2019-02-05 NOTE — Progress Notes (Signed)
Subjective:  Patient ID: Erin Mays, female    DOB: 07-19-1981  Age: 38 y.o. MRN: [id number]  CC: Cough   HPI Erin Mays presents for an intermittent cough for 2 months that has worsened over the last week and is now productive of thick yellow phlegm.  She has rib cage pain with the cough and says the cough keeps her awake at night.  She has a history of exercise-induced asthma.  Outpatient Medications Prior to Visit  Medication Sig Dispense Refill  . loratadine (CLARITIN) 10 MG tablet Take 10 mg by mouth as needed.     . sertraline (ZOLOFT) 100 MG tablet TAKE 1 TABLET BY MOUTH EVERY DAY 90 tablet 1  . clonazePAM (KLONOPIN) 1 MG tablet Take 1 tablet (1 mg total) by mouth 3 (three) times daily as needed. for anxiety 90 tablet 2  . drospirenone-ethinyl estradiol (YASMIN,ZARAH,SYEDA) 3-0.03 MG tablet Take 1 tablet by mouth daily.     No facility-administered medications prior to visit.     ROS Review of Systems  Constitutional: Negative for chills, fatigue and fever.  HENT: Negative.  Negative for sinus pressure, sore throat, trouble swallowing and voice change.   Respiratory: Positive for cough. Negative for chest tightness, shortness of breath and wheezing.   Cardiovascular: Negative for chest pain, palpitations and leg swelling.  Gastrointestinal: Negative for abdominal pain, diarrhea, nausea and vomiting.  Endocrine: Negative.   Genitourinary: Negative.  Negative for difficulty urinating.  Musculoskeletal: Negative.  Negative for arthralgias and myalgias.  Skin: Negative.   Hematological: Negative for adenopathy. Does not bruise/bleed easily.  Psychiatric/Behavioral: Positive for sleep disturbance. Negative for agitation, behavioral problems, confusion, decreased concentration, dysphoric mood, hallucinations and self-injury. The patient is nervous/anxious. The patient is not hyperactive.     Objective:  BP 124/72 (BP Location: Left Arm, Patient Position: Sitting,  Cuff Size: Normal)   Pulse 64   Temp 98.2 F (36.8 C) (Oral)   Resp 16   Ht 5\' 6"  (1.676 m)   Wt 148 lb 8 oz (67.4 kg)   LMP 01/21/2019 (Exact Date)   SpO2 99%   BMI 23.97 kg/m   BP Readings from Last 3 Encounters:  02/05/19 124/72  08/29/18 128/74  11/29/17 110/80    Wt Readings from Last 3 Encounters:  02/05/19 148 lb 8 oz (67.4 kg)  08/29/18 146 lb (66.2 kg)  11/29/17 148 lb 4 oz (67.2 kg)    Physical Exam Vitals signs reviewed.  Constitutional:      Appearance: She is not ill-appearing.  HENT:     Nose: Nose normal. No congestion or rhinorrhea.     Mouth/Throat:     Mouth: Mucous membranes are moist.     Pharynx: Oropharynx is clear. No oropharyngeal exudate or posterior oropharyngeal erythema.  Eyes:     General: No scleral icterus.    Conjunctiva/sclera: Conjunctivae normal.  Neck:     Musculoskeletal: Normal range of motion and neck supple.     Vascular: No carotid bruit.  Cardiovascular:     Rate and Rhythm: Normal rate and regular rhythm.     Pulses: Normal pulses.     Heart sounds: No murmur. No gallop.   Pulmonary:     Effort: Pulmonary effort is normal. No respiratory distress.     Breath sounds: No stridor. No wheezing or rales.  Abdominal:     General: Bowel sounds are normal.     Palpations: There is no hepatomegaly, splenomegaly or mass.     Tenderness:  There is no abdominal tenderness.  Musculoskeletal: Normal range of motion.        General: No swelling.     Right lower leg: No edema.     Left lower leg: No edema.  Skin:    General: Skin is dry.  Neurological:     General: No focal deficit present.     Mental Status: She is oriented to person, place, and time. Mental status is at baseline.  Psychiatric:        Attention and Perception: Attention normal.        Mood and Affect: Affect normal. Mood is anxious.        Speech: Speech normal.        Behavior: Behavior normal.        Thought Content: Thought content normal.        Cognition  and Memory: Cognition normal.        Judgment: Judgment normal.     Lab Results  Component Value Date   WBC 7.3 07/06/2016   HGB 13.4 07/06/2016   HCT 43 07/06/2016   PLT 319 07/06/2016   GLUCOSE 83 04/14/2015   CHOL 216 (H) 11/29/2017   TRIG 85.0 11/29/2017   HDL 82.60 11/29/2017   LDLDIRECT 147.9 10/29/2013   LDLCALC 116 (H) 11/29/2017   ALT 8 07/06/2016   AST 14 07/06/2016   NA 142 07/06/2016   K 4.6 07/06/2016   CL 103 04/14/2015   CREATININE 0.7 07/06/2016   BUN 14 07/06/2016   CO2 27 04/14/2015   TSH 1.70 11/29/2017    Dg Finger Middle Right  Result Date: 08/29/2018 CLINICAL DATA:  Pain and swelling following injury 5 weeks previously EXAM: RIGHT THIRD FINGER 2+V COMPARISON:  None. FINDINGS: Frontal, oblique, and lateral views obtained. There is mild soft tissue swelling. There is a rather subtle linear lucency in the distal aspect of the third middle phalanx, felt to represent a nondisplaced fracture. No other evident fracture. No dislocation. Joint spaces appear normal. No erosive change. IMPRESSION: Apparent nondisplaced fracture distal aspect third middle phalanx. No other evident fracture. No dislocation. Mild soft tissue swelling noted. No appreciable arthropathy. These results will be called to the ordering clinician or representative by the Radiologist Assistant, and communication documented in the PACS or zVision Dashboard. Electronically Signed   By: Bretta BangWilliam  Woodruff III M.D.   On: 08/29/2018 10:21   [place] Rt Upper Extrem Ltd Soft Tissue Non Vascular  Result Date: 09/25/2018 Limited ultrasound: Right middle finger:  Dynamic testing reveals that there appears to be excessive translation with ulnar and radial deviation. mild effusion of the PIP joint. No obvious bony defects or lesion.  Summary: Findings suggestive of the volar plate  Ultrasound and interpretation by Clare GandyJeremy Schmitz, MD   No results found.  Assessment & Plan:   Erin Mays was seen today for  cough.  Diagnoses and all orders for this visit:  Cough- Her chest x-ray is negative for mass or infiltrate.  Her FeNP score is not elevated so I do not think she needs to be treated for an allergic or eosinophilic condition with steroids. -     DG Chest 2 View; Future -     POCT EXHALED NITRIC OXIDE -     Discontinue: HYDROcodone-homatropine (HYCODAN) 5-1.5 MG/5ML syrup; Take 5 mLs by mouth every 8 (eight) hours as needed for up to 7 days for cough. -     HYDROcodone-homatropine (HYCODAN) 5-1.5 MG/5ML syrup; Take 5 mLs by mouth every 8 (  eight) hours as needed for up to 8 days for cough.  GAD (generalized anxiety disorder) -     clonazePAM (KLONOPIN) 1 MG tablet; Take 1 tablet (1 mg total) by mouth 2 (two) times daily as needed. for anxiety  Depression with anxiety -     clonazePAM (KLONOPIN) 1 MG tablet; Take 1 tablet (1 mg total) by mouth 2 (two) times daily as needed. for anxiety  RTI (respiratory tract infection)- Will treat with a broad-spectrum antibiotic and I have offered a cough suppressant as well. -     Discontinue: HYDROcodone-homatropine (HYCODAN) 5-1.5 MG/5ML syrup; Take 5 mLs by mouth every 8 (eight) hours as needed for up to 7 days for cough. -     cefdinir (OMNICEF) 300 MG capsule; Take 1 capsule (300 mg total) by mouth 2 (two) times daily for 10 days. -     HYDROcodone-homatropine (HYCODAN) 5-1.5 MG/5ML syrup; Take 5 mLs by mouth every 8 (eight) hours as needed for up to 8 days for cough.   I have discontinued Tauriel Sheahan's drospirenone-ethinyl estradiol. I have also changed her clonazePAM and HYDROcodone-homatropine. Additionally, I am having her start on cefdinir. Lastly, I am having her maintain her loratadine and sertraline.  Meds ordered this encounter  Medications  . clonazePAM (KLONOPIN) 1 MG tablet    Sig: Take 1 tablet (1 mg total) by mouth 2 (two) times daily as needed. for anxiety    Dispense:  60 tablet    Refill:  3  . DISCONTD:  HYDROcodone-homatropine (HYCODAN) 5-1.5 MG/5ML syrup    Sig: Take 5 mLs by mouth every 8 (eight) hours as needed for up to 7 days for cough.    Dispense:  120 mL    Refill:  0  . cefdinir (OMNICEF) 300 MG capsule    Sig: Take 1 capsule (300 mg total) by mouth 2 (two) times daily for 10 days.    Dispense:  20 capsule    Refill:  0  . HYDROcodone-homatropine (HYCODAN) 5-1.5 MG/5ML syrup    Sig: Take 5 mLs by mouth every 8 (eight) hours as needed for up to 8 days for cough.    Dispense:  120 mL    Refill:  0     Follow-up: Return if symptoms worsen or fail to improve.  Sanda Linger, MD

## 2019-02-05 NOTE — Patient Instructions (Signed)
Cough, Adult  Coughing is a reflex that clears your throat and your airways. Coughing helps to heal and protect your lungs. It is normal to cough occasionally, but a cough that happens with other symptoms or lasts a long time may be a sign of a condition that needs treatment. A cough may last only 2-3 weeks (acute), or it may last longer than 8 weeks (chronic). What are the causes? Coughing is commonly caused by:  Breathing in substances that irritate your lungs.  A viral or bacterial respiratory infection.  Allergies.  Asthma.  Postnasal drip.  Smoking.  Acid backing up from the stomach into the esophagus (gastroesophageal reflux).  Certain medicines.  Chronic lung problems, including COPD (or rarely, lung cancer).  Other medical conditions such as heart failure. Follow these instructions at home: Pay attention to any changes in your symptoms. Take these actions to help with your discomfort:  Take medicines only as told by your health care provider. ? If you were prescribed an antibiotic medicine, take it as told by your health care provider. Do not stop taking the antibiotic even if you start to feel better. ? Talk with your health care provider before you take a cough suppressant medicine.  Drink enough fluid to keep your urine clear or pale yellow.  If the air is dry, use a cold steam vaporizer or humidifier in your bedroom or your home to help loosen secretions.  Avoid anything that causes you to cough at work or at home.  If your cough is worse at night, try sleeping in a semi-upright position.  Avoid cigarette smoke. If you smoke, quit smoking. If you need help quitting, ask your health care provider.  Avoid caffeine.  Avoid alcohol.  Rest as needed. Contact a health care provider if:  You have new symptoms.  You cough up pus.  Your cough does not get better after 2-3 weeks, or your cough gets worse.  You cannot control your cough with suppressant  medicines and you are losing sleep.  You develop pain that is getting worse or pain that is not controlled with pain medicines.  You have a fever.  You have unexplained weight loss.  You have night sweats. Get help right away if:  You cough up blood.  You have difficulty breathing.  Your heartbeat is very fast. This information is not intended to replace advice given to you by your health care provider. Make sure you discuss any questions you have with your health care provider. Document Released: 06/01/2011 Document Revised: 05/10/2016 Document Reviewed: 02/09/2015 Elsevier Interactive Patient Education  2019 Elsevier Inc.  

## 2019-02-16 ENCOUNTER — Ambulatory Visit: Payer: Self-pay

## 2019-02-16 NOTE — Telephone Encounter (Signed)
Pt c/o persistent cough that comes and goes. Cough is occasionally productive with yellow phlegm. Denies fever, SOB, runny nose, wheezing, coughing up blood.  Pt stated that the cough has been present since 01/31/19. Pt had OV 02/05/19 and was prescribed Hycodan for cough  and has finished a 10 day course of cefdinir. Pt refused to make appt and stated she will call back if worsens. Routing to office for provider to review if she needs another round of abx. Care advice given and pt verbalized understanding.   Reason for Disposition . Cough has been present for > 3 weeks . Cough  Answer Assessment - Initial Assessment Questions 1. ONSET: "When did the cough begin?"      01/31/19 2. SEVERITY: "How bad is the cough today?"      Comes and goes 3. RESPIRATORY DISTRESS: "Describe your breathing."      no 4. FEVER: "Do you have a fever?" If so, ask: "What is your temperature, how was it measured, and when did it start?"     no 5. SPUTUM: "Describe the color of your sputum" (clear, white, yellow, green)     yellow 6. HEMOPTYSIS: "Are you coughing up any blood?" If so ask: "How much?" (flecks, streaks, tablespoons, etc.)     no 7. CARDIAC HISTORY: "Do you have any history of heart disease?" (e.g., heart attack, congestive heart failure)      no 8. LUNG HISTORY: "Do you have any history of lung disease?"  (e.g., pulmonary embolus, asthma, emphysema)     Exercise induced asthma 9. PE RISK FACTORS: "Do you have a history of blood clots?" (or: recent major surgery, recent prolonged travel, bedridden)     no 10. OTHER SYMPTOMS: "Do you have any other symptoms?" (e.g., runny nose, wheezing, chest pain)       no 11. PREGNANCY: "Is there any chance you are pregnant?" "When was your last menstrual period?"       No/ LMP: yesterday 12. TRAVEL: "Have you traveled out of the country in the last month?" (e.g., travel history, exposures)       no  Protocols used: COUGH - ACUTE PRODUCTIVE-A-AH

## 2019-02-19 ENCOUNTER — Ambulatory Visit (INDEPENDENT_AMBULATORY_CARE_PROVIDER_SITE_OTHER): Payer: BLUE CROSS/BLUE SHIELD | Admitting: Internal Medicine

## 2019-02-19 ENCOUNTER — Encounter: Payer: Self-pay | Admitting: Internal Medicine

## 2019-02-19 DIAGNOSIS — R059 Cough, unspecified: Secondary | ICD-10-CM

## 2019-02-19 DIAGNOSIS — J3089 Other allergic rhinitis: Secondary | ICD-10-CM | POA: Diagnosis not present

## 2019-02-19 DIAGNOSIS — Z77120 Contact with and (suspected) exposure to mold (toxic): Secondary | ICD-10-CM | POA: Diagnosis not present

## 2019-02-19 DIAGNOSIS — R05 Cough: Secondary | ICD-10-CM | POA: Diagnosis not present

## 2019-02-19 MED ORDER — BENZONATATE 200 MG PO CAPS: 200.0000 mg | ORAL_CAPSULE | Freq: Three times a day (TID) | ORAL | 0 refills | Status: DC | PRN

## 2019-02-19 NOTE — Patient Instructions (Signed)
We would like you to start claritin again.   We have given you a sample of nasacort to use 2 sprays in each nostril daily.   We have sent in tessalon perles to use up to 3 times a day.

## 2019-02-19 NOTE — Progress Notes (Signed)
   Subjective:   Patient ID: Erin Mays, female    DOB: 06-26-1981, 38 y.o.   MRN: [id number]  HPI The patient is a 38 y.o. female coming in for cold/cough symptoms. Started about 2-3 months ago. Main symptoms are: cough, sinus drainage started in the last two weeks. Just saw PCP about 2 weeks ago and had FENO (negative) as well as CXR (negative). She was given 2 weeks of cefdinir which she took. She feels that the cough has worsened over that time. She previously was not having significant nasal drainage but is currently. She is not taking claritin now but has taken in the past. As we are talking about her current symptoms she asks if mold could be related. 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 Denies fevers or chills or headaches or body aches. Overall it is worsening. Has tried antibiotics. Denies SOB with activity and activity levels are still normal.   Review of Systems  Constitutional: Negative for activity change, appetite change, chills, fatigue, fever and unexpected weight change.  HENT: Positive for congestion, postnasal drip, rhinorrhea and sinus pressure. Negative for ear discharge, ear pain, sinus pain, sneezing, sore throat, tinnitus, trouble swallowing and voice change.   Eyes: Negative.   Respiratory: Positive for cough. Negative for chest tightness, shortness of breath and wheezing.   Cardiovascular: Negative.   Gastrointestinal: Negative.   Musculoskeletal: Negative for myalgias.  Neurological: Negative.     Objective:  Physical Exam Constitutional:      Appearance: She is well-developed.  HENT:     Head: Normocephalic and atraumatic.     Comments: Oropharynx with redness and clear drainage, nose with swollen turbinates, TMs normal bilaterally.  Neck:     Musculoskeletal: Normal range of motion.     Thyroid: No thyromegaly.  Cardiovascular:     Rate and Rhythm: Normal rate and  regular rhythm.  Pulmonary:     Effort: Pulmonary effort is normal. No respiratory distress.     Breath sounds: Normal breath sounds. No wheezing or rales.  Abdominal:     General: Bowel sounds are normal. There is no distension.     Palpations: Abdomen is soft.     Tenderness: There is no abdominal tenderness. There is no rebound.  Musculoskeletal:        General: Tenderness present.  Lymphadenopathy:     Cervical: No cervical adenopathy.  Skin:    General: Skin is warm and dry.  Neurological:     Mental Status: She is alert and oriented to person, place, and time.     Coordination: Coordination normal.     Vitals:   02/19/19 1558  BP: 116/60  Pulse: 68  Temp: 98.2 F (36.8 C)  TempSrc: Oral  SpO2: 97%  Weight: 147 lb (66.7 kg)  Height: 5\' 6"  (1.676 m)    Assessment & Plan:  Visit time 25 minutes: greater than 50% of that time was spent in face to face counseling and coordination of care with the patient: counseled about options for treatment for hoarding as well as the dangers associated with mold and the effects of mold chronically on the sinuses and allergies

## 2019-02-20 ENCOUNTER — Ambulatory Visit: Payer: BLUE CROSS/BLUE SHIELD | Admitting: Internal Medicine

## 2019-02-20 DIAGNOSIS — Z77120 Contact with and (suspected) exposure to mold (toxic): Secondary | ICD-10-CM | POA: Insufficient documentation

## 2019-02-20 NOTE — Assessment & Plan Note (Addendum)
Suspect related to new knowledge of mold exposure. Advised accordingly, no antibiotics indicated today. Rx for tessalon perles.

## 2019-02-20 NOTE — Assessment & Plan Note (Signed)
Advised to start zyrtec and given sample of nasacort. Advised to seek care for the partner and work on evaluation and remediation of the mold to avoid further or long term health consequences. We talked about how some people do have chronic sinus and URI symptoms associated with mold exposure.

## 2019-02-20 NOTE — Assessment & Plan Note (Signed)
Advised to start back nose spray and claritin or zyrtec.

## 2019-02-25 ENCOUNTER — Telehealth: Payer: Self-pay | Admitting: Internal Medicine

## 2019-02-25 NOTE — Telephone Encounter (Signed)
Contacted pt and informed that her sx are either viral or mold related. Also stated that abx can not be rx'ed without a visit. Pt stated that she has not looked into the possible mold issue in her home. I explained the longer term affects of mold exposure and encouraged to have it treated if that is the issue.   Pt stated understanding and will call back with updates.

## 2019-02-25 NOTE — Telephone Encounter (Signed)
Copied from CRM [id number]. Topic: General - Inquiry >> Feb 25, 2019  1:49 PM Richarda Blade wrote: Reason for CRM: Patient has been seen twice for the same issue and was adamant about not having another appointment. Patient was given an antibiotic and finished it and it did not work. Patient would like a stronger antibiotic or something else.

## 2019-02-27 ENCOUNTER — Encounter: Payer: Self-pay | Admitting: Family

## 2019-02-27 ENCOUNTER — Ambulatory Visit (INDEPENDENT_AMBULATORY_CARE_PROVIDER_SITE_OTHER): Payer: BLUE CROSS/BLUE SHIELD | Admitting: Family

## 2019-02-27 ENCOUNTER — Other Ambulatory Visit: Payer: Self-pay

## 2019-02-27 VITALS — BP 110/68 | HR 79 | Temp 97.9°F | Ht 66.0 in | Wt 142.1 lb

## 2019-02-27 DIAGNOSIS — J019 Acute sinusitis, unspecified: Secondary | ICD-10-CM

## 2019-02-27 MED ORDER — PREDNISONE 20 MG PO TABS: 20.0000 mg | ORAL_TABLET | Freq: Every day | ORAL | 0 refills | Status: DC

## 2019-02-27 MED ORDER — FLUTICASONE PROPIONATE 50 MCG/ACT NA SUSP: 2.0000 | Freq: Every day | NASAL | 6 refills | Status: DC

## 2019-02-27 MED ORDER — DOXYCYCLINE HYCLATE 100 MG PO TABS: 100.0000 mg | ORAL_TABLET | Freq: Two times a day (BID) | ORAL | 0 refills | Status: DC

## 2019-02-27 NOTE — Progress Notes (Signed)
Erin Mays is a 38 y.o. female with the following history as recorded in [employer]:  Patient Active Problem List   Diagnosis Date Noted  . Mold suspected exposure 02/20/2019  . Cough 02/05/2019  . RTI (respiratory tract infection) 02/05/2019  . Pain of right middle finger 08/31/2018  . Traveler's diarrhea 06/09/2018  . Other type of migraine 05/31/2015  . Seborrheic dermatitis 09/25/2012  . Contraception management 09/25/2012  . Routine general medical examination at a health care facility 09/20/2011  . Hyperlipidemia with target LDL less than 160 08/17/2009  . GAD (generalized anxiety disorder) 08/16/2008  . Insomnia due to anxiety and fear 06/05/2007  . Allergic rhinitis 05/14/2007  . Depression with anxiety 05/07/2007    Current Outpatient Medications  Medication Sig Dispense Refill  . benzonatate (TESSALON) 200 MG capsule Take 1 capsule (200 mg total) by mouth 3 (three) times daily as needed. 60 capsule 0  . clonazePAM (KLONOPIN) 1 MG tablet Take 1 tablet (1 mg total) by mouth 2 (two) times daily as needed. for anxiety 60 tablet 3  . loratadine (CLARITIN) 10 MG tablet Take 10 mg by mouth as needed.     . sertraline (ZOLOFT) 100 MG tablet TAKE 1 TABLET BY MOUTH EVERY DAY 90 tablet 1  . doxycycline (VIBRA-TABS) 100 MG tablet Take 1 tablet (100 mg total) by mouth 2 (two) times daily. 20 tablet 0  . fluticasone (FLONASE) 50 MCG/ACT nasal spray Place 2 sprays into both nostrils daily. 16 g 6  . predniSONE (DELTASONE) 20 MG tablet Take 1 tablet (20 mg total) by mouth daily with breakfast. 5 tablet 0   No current facility-administered medications for this visit.     Allergies: Patient has no known allergies.  Past Medical History:  Diagnosis Date  . Allergy   . Anxiety   . Depression   . Headache   . Hyperlipidemia     Past Surgical History:  Procedure Laterality Date  . APPENDECTOMY      Family History  Problem Relation Age of Onset  . Hypertension Mother   .  Diabetes Maternal Aunt        breast cancer- great aunt  . Diabetes Maternal Grandfather   . Depression Other   . Hyperlipidemia Other   . Hypertension Other   . Cancer Paternal Grandmother        GIST  . Depression Sister   . Depression Brother     Social History   Tobacco Use  . Smoking status: Never Smoker  . Smokeless tobacco: Never Used  Substance Use Topics  . Alcohol use: Yes    Alcohol/week: 3.0 standard drinks    Types: 3 Glasses of wine per week    Subjective:  Presents with concerns for persisting sinus infection; has been using Claritin D and Flonase and Tessalon Perles; no fever; + facial pressure; no chest pain or shortness of breath; originally seen in mid-February and treated with 10 days of Omnicef/ normal CXR at that time;  Was seen last week with persisting symptoms and thought to be allergy related but symptoms are worsening;  Has had home tested for mold and not felt to be the problem;   LMP- 02/22/2019    Objective:  Vitals:   02/27/19 1557  BP: 110/68  Pulse: 79  Temp: 97.9 F (36.6 C)  TempSrc: Oral  SpO2: 98%  Weight: 142 lb 1.9 oz (64.5 kg)  Height: 5\' 6"  (1.676 m)    General: Well developed, well nourished, in no  acute distress  Skin : Warm and dry.  Head: Normocephalic and atraumatic  Eyes: Sclera and conjunctiva clear; pupils round and reactive to light; extraocular movements intact  Ears: External normal; canals clear; tympanic membranes congested bilaterally Oropharynx: Pink, supple. No suspicious lesions  Neck: Supple without thyromegaly, adenopathy  Lungs: Respirations unlabored; clear to auscultation bilaterally without wheeze, rales, rhonchi  CVS exam: normal rate and regular rhythm.  Neurologic: Alert and oriented; speech intact; face symmetrical; moves all extremities well; CNII-XII intact without focal deficit   Assessment:  1. Acute sinusitis, recurrence not specified, unspecified location     Plan:  Rx for Doxycycline 100  mg bid x 10 days, Prednisone 20 mg qd x 5 days; if symptoms persist, will need to consider ENT evaluation.   No follow-ups on file.  No orders of the defined types were placed in this encounter.   Requested Prescriptions   Signed Prescriptions Disp Refills  . doxycycline (VIBRA-TABS) 100 MG tablet 20 tablet 0    Sig: Take 1 tablet (100 mg total) by mouth 2 (two) times daily.  . predniSONE (DELTASONE) 20 MG tablet 5 tablet 0    Sig: Take 1 tablet (20 mg total) by mouth daily with breakfast.  . fluticasone (FLONASE) 50 MCG/ACT nasal spray 16 g 6    Sig: Place 2 sprays into both nostrils daily.

## 2019-03-02 ENCOUNTER — Telehealth: Payer: Self-pay

## 2019-03-02 NOTE — Telephone Encounter (Signed)
Copied from CRM [id number]. Topic: Quick Communication - See Telephone Encounter >> Mar 02, 2019  9:59 AM Maia Petties wrote: CRM for notification. See Telephone encounter for: 03/02/19. Pt has been sick for over a month and into the office for 3 OV. Pt worried about low immunity due to illness and since she is on prednisone. Pt is asking for a note to give to her employer documenting her illness and low immunity and possibility of working from home. Note to state she is at higher risk for COVID-19 and/or may need to self quarantine for illness. Please call at [phone] before patient at faxing (pt on a floor with 100 people) fax # [phone].

## 2019-03-02 NOTE — Telephone Encounter (Signed)
Pt informed letter was done.

## 2019-03-02 NOTE — Telephone Encounter (Signed)
I think she has had a couple respiratory infections in the past few months but I am not aware of her having an immune disorder.  However, if she wants a note that allows her to work for home then I am okay with that.

## 2019-04-11 ENCOUNTER — Ambulatory Visit (INDEPENDENT_AMBULATORY_CARE_PROVIDER_SITE_OTHER): Payer: BLUE CROSS/BLUE SHIELD

## 2019-04-11 ENCOUNTER — Ambulatory Visit (HOSPITAL_COMMUNITY)
Admission: EM | Admit: 2019-04-11 | Discharge: 2019-04-11 | Disposition: A | Discharge status: Home / Self Care | Payer: BLUE CROSS/BLUE SHIELD | Attending: Family Medicine | Admitting: Family Medicine

## 2019-04-11 ENCOUNTER — Encounter (HOSPITAL_COMMUNITY): Payer: Self-pay | Admitting: Emergency Medicine

## 2019-04-11 DIAGNOSIS — S82832A Other fracture of upper and lower end of left fibula, initial encounter for closed fracture: Secondary | ICD-10-CM | POA: Diagnosis not present

## 2019-04-11 DIAGNOSIS — S82899D Other fracture of unspecified lower leg, subsequent encounter for closed fracture with routine healing: Secondary | ICD-10-CM | POA: Diagnosis not present

## 2019-04-11 MED ORDER — IBUPROFEN 800 MG PO TABS
800.0000 mg | ORAL_TABLET | Freq: Once | ORAL | Status: AC
  Administered 2019-04-11: 800 mg via ORAL

## 2019-04-11 MED ORDER — IBUPROFEN 800 MG PO TABS
ORAL_TABLET | ORAL | Status: AC
  Filled 2019-04-11: qty 1

## 2019-04-11 NOTE — ED Triage Notes (Signed)
Pt states she twisted her L ankle while working in the yard today. L ankle has significant swelling.

## 2019-04-11 NOTE — ED Provider Notes (Signed)
[facility]   [id number] 04/11/19 Arrival Time: 1103  ASSESSMENT & PLAN:  1. Closed malleolar fracture, unspecified laterality, with routine healing, subsequent encounter    I have personally viewed the imaging studies ordered this visit. Distal fibula fracture. Non-displaced.  Follow-up Information    [facility]   Specialty:  Specialist Contact information: [address] [place] [address] [phone]        [facility], [facility]   Specialty:  Orthopedic Surgery Contact information: [facility] [address] [place] [address] [phone]          Meds ordered this encounter  Medications  . ibuprofen (ADVIL) tablet 800 mg   Orders Placed This Encounter  Procedures  . DG Ankle Complete Left  . Crutches  . Apply cam walker   OTC analgesics as needed.  Reviewed expectations re: course of current medical issues. Questions answered. Outlined signs and symptoms indicating need for more acute intervention. Patient verbalized understanding. After Visit Summary given.  SUBJECTIVE: History from: patient. Erin Mays is a 38 y.o. female who reports persistent marked pain of her left ankle; described as aching/throbbing without radiation. Onset: abrupt, yesterday. Injury/trama: yes, reports twisting ankle in her yard yesterday; able to bear some weight but with pain/discomfort. Swelling has increased overnight. Aggravating factors: movement and weight-bearing. Alleviating factors: rest. Associated symptoms: none reported. Extremity sensation changes or weakness: none. Self treatment: has not tried OTCs for relief of pain. No h/o previous ankle injury reported.   Past Surgical History:  Procedure Laterality Date  . APPENDECTOMY      ROS: As per HPI. All other systems negative.    OBJECTIVE:  Vitals:   04/11/19 1113  BP: 116/79  Pulse: 87  Resp: 18  Temp: (!)  97.1 F (36.2 C)  SpO2: 98%    General appearance: alert; no distress HEENT: [place]; AT Neck: supple with FROM Extremities: . LUE: warm and well perfused; fairly well localized marked tenderness over left lateral ankle; without gross deformities; with moderate lateral swelling; with no bruising; ROM: limited by pain CV: brisk extremity capillary refill of LLE; 2+ DP pulse of LLE. Skin: warm and dry; no visible rashes Neurologic: gait abnormal: favors L foot/ankle; normal reflexes of RLE and LLE; normal sensation of RLE and LLE; normal strength of RLE and LLE Psychological: alert and cooperative; normal mood and affect  Imaging: Dg Ankle Complete Left  Result Date: 04/11/2019 CLINICAL DATA:  Twisting injury. EXAM: LEFT ANKLE COMPLETE - 3+ VIEW COMPARISON:  None. FINDINGS: There is lateral malleolar soft tissue swelling. There is a fractures through the distal fibular metaphysis without significant displacement. The ankle mortise is intact. A crescent shaped 3.6 mm region of high attenuation is seen along the bottom of the foot on the lateral view at the level of the metatarsal bases. IMPRESSION: 1. Distal fibular fracture with associated soft tissue swelling. 2. The 4 mm crescent-shaped focus of high attenuation along the bottom of the foot on the lateral view at the level of the metatarsal bases could represent a soft tissue calcification or and age indeterminate foreign body. Recommend clinical correlation. Electronically Signed   By: Gerome Samavid  Williams III M.D   On: 04/11/2019 11:53    No Known Allergies  Past Medical History:  Diagnosis Date  . Allergy   . Anxiety   . Depression   . Headache   . Hyperlipidemia    Social History   Socioeconomic History  . Marital status:  Single    Spouse name: Not on file  . Number of children: Not on file  . Years of education: Not on file  . Highest education level: Not on file  Occupational History  . Occupation: Training and development officer    Comment: [employer]   Social Needs  . Financial resource strain: Not on file  . Food insecurity:    Worry: Not on file    Inability: Not on file  . Transportation needs:    Medical: Not on file    Non-medical: Not on file  Tobacco Use  . Smoking status: Never Smoker  . Smokeless tobacco: Never Used  Substance and Sexual Activity  . Alcohol use: Yes    Alcohol/week: 3.0 standard drinks    Types: 3 Glasses of wine per week  . Drug use: No  . Sexual activity: Yes    Partners: Male    Birth control/protection: Pill  Lifestyle  . Physical activity:    Days per week: Not on file    Minutes per session: Not on file  . Stress: Not on file  Relationships  . Social connections:    Talks on phone: Not on file    Gets together: Not on file    Attends religious service: Not on file    Active member of club or organization: Not on file    Attends meetings of clubs or organizations: Not on file    Relationship status: Not on file  Other Topics Concern  . Not on file  Social History Narrative   Domestic Partner   Pt is a vegitarian   Regular exercise-Yes   Family History  Problem Relation Age of Onset  . Hypertension Mother   . Diabetes Maternal Aunt        breast cancer- great aunt  . Diabetes Maternal Grandfather   . Depression Other   . Hyperlipidemia Other   . Hypertension Other   . Cancer Paternal Grandmother        GIST  . Depression Sister   . Depression Brother    Past Surgical History:  Procedure Laterality Date  . [name], MD 04/11/19 1350

## 2019-04-13 DIAGNOSIS — M25572 Pain in left ankle and joints of left foot: Secondary | ICD-10-CM | POA: Diagnosis not present

## 2019-05-12 DIAGNOSIS — M25572 Pain in left ankle and joints of left foot: Secondary | ICD-10-CM | POA: Diagnosis not present

## 2019-05-12 DIAGNOSIS — S82832A Other fracture of upper and lower end of left fibula, initial encounter for closed fracture: Secondary | ICD-10-CM | POA: Diagnosis not present

## 2019-05-24 ENCOUNTER — Other Ambulatory Visit: Payer: Self-pay | Admitting: Internal Medicine

## 2019-05-24 DIAGNOSIS — F418 Other specified anxiety disorders: Secondary | ICD-10-CM

## 2019-06-16 DIAGNOSIS — R5383 Other fatigue: Secondary | ICD-10-CM | POA: Diagnosis not present

## 2019-06-16 DIAGNOSIS — S82832D Other fracture of upper and lower end of left fibula, subsequent encounter for closed fracture with routine healing: Secondary | ICD-10-CM | POA: Diagnosis not present

## 2019-06-16 DIAGNOSIS — E559 Vitamin D deficiency, unspecified: Secondary | ICD-10-CM | POA: Diagnosis not present

## 2019-07-04 IMAGING — DX LEFT ANKLE COMPLETE - 3+ VIEW
3 series · 3 of 3 positions shown · non-contrast
Comparison: None.

CLINICAL DATA: Twisting injury.

EXAM:
LEFT ANKLE COMPLETE - 3+ VIEW

[ankle ap]
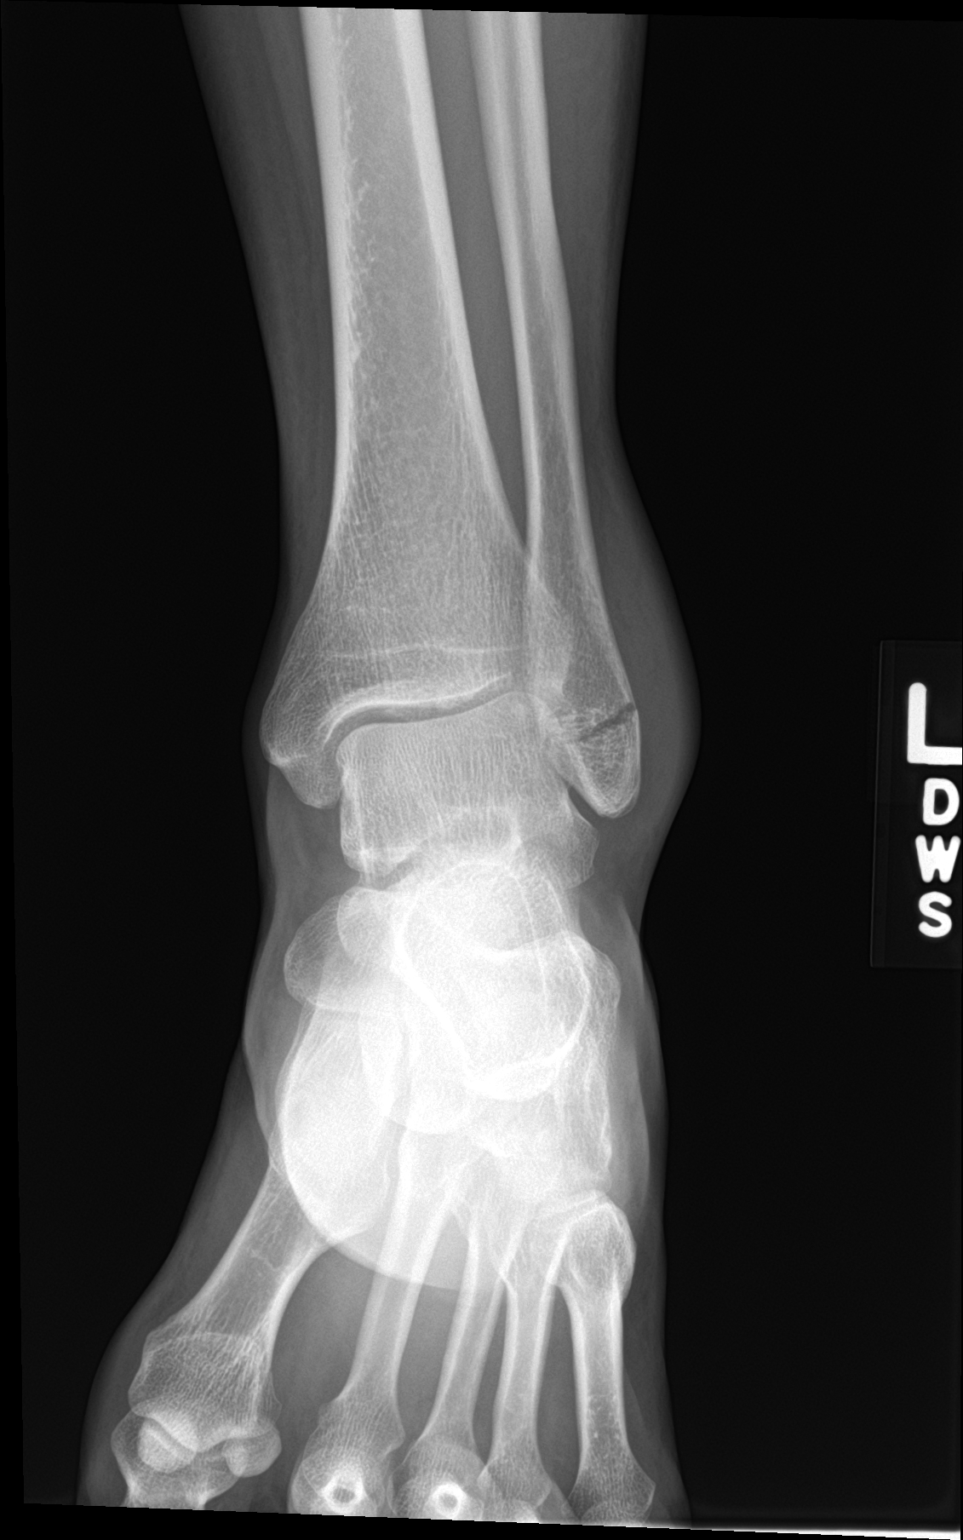

[ankle obl]
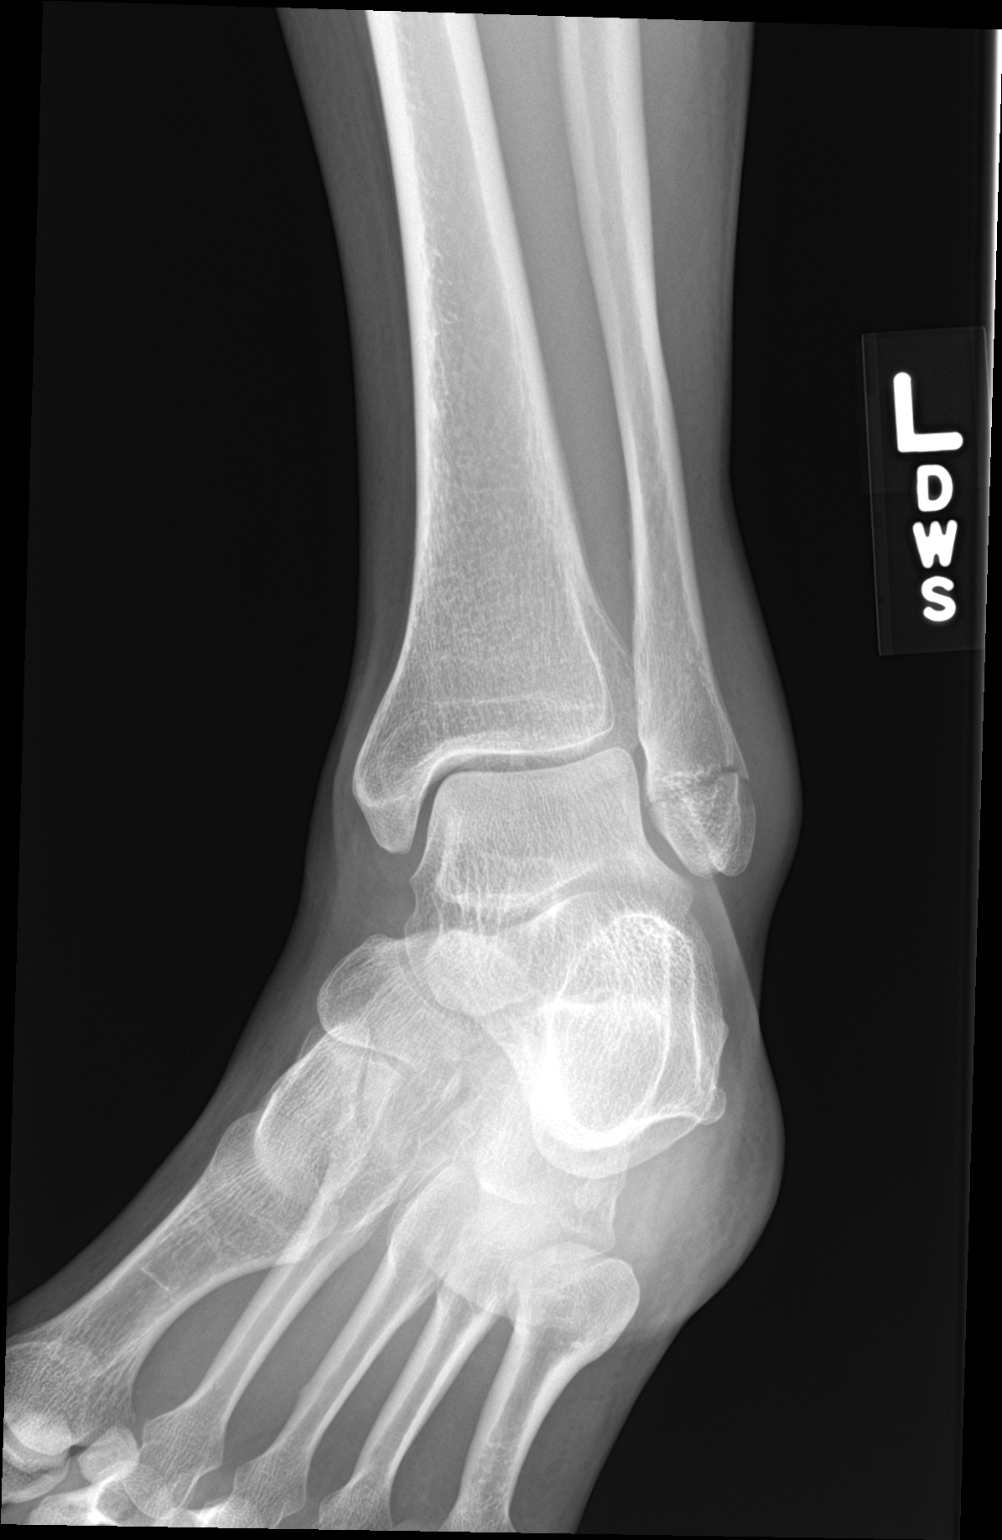

[ankle lat]
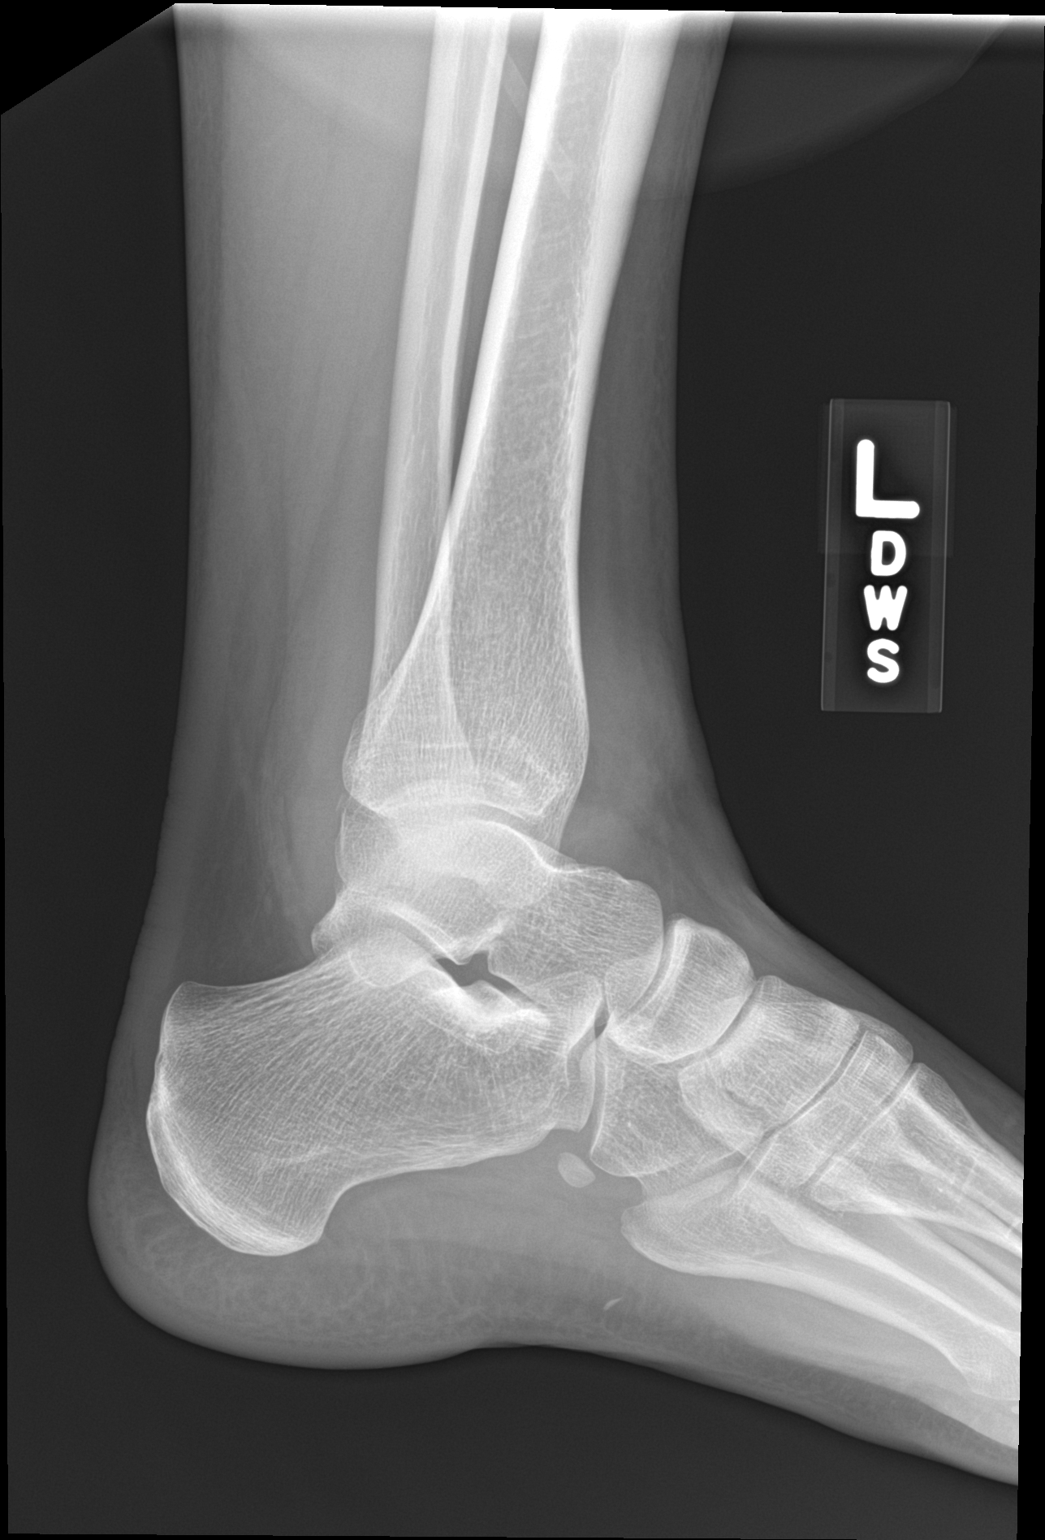

[3 of 3 positions shown; findings below may reference images not displayed]

FINDINGS: There is lateral malleolar soft tissue swelling. There is a
fractures through the distal fibular metaphysis without significant
displacement. The ankle mortise is intact. A crescent shaped 3.6 mm
region of high attenuation is seen along the bottom of the foot on
the lateral view at the level of the metatarsal bases.
IMPRESSION: 1. Distal fibular fracture with associated soft tissue swelling.
2. The 4 mm crescent-shaped focus of high attenuation along the
bottom of the foot on the lateral view at the level of the
metatarsal bases could represent a soft tissue calcification or and
age indeterminate foreign body. Recommend clinical correlation.

## 2019-08-25 ENCOUNTER — Other Ambulatory Visit: Payer: Self-pay | Admitting: Internal Medicine

## 2019-08-25 DIAGNOSIS — F411 Generalized anxiety disorder: Secondary | ICD-10-CM

## 2019-08-25 DIAGNOSIS — F418 Other specified anxiety disorders: Secondary | ICD-10-CM

## 2019-09-08 ENCOUNTER — Other Ambulatory Visit: Payer: Self-pay

## 2019-09-08 ENCOUNTER — Encounter: Payer: Self-pay | Admitting: Internal Medicine

## 2019-09-08 ENCOUNTER — Ambulatory Visit (INDEPENDENT_AMBULATORY_CARE_PROVIDER_SITE_OTHER): Payer: BC Managed Care – PPO | Admitting: Internal Medicine

## 2019-09-08 VITALS — BP 130/80 | HR 68 | Temp 98.2°F | Ht 66.0 in | Wt 142.0 lb

## 2019-09-08 DIAGNOSIS — Z23 Encounter for immunization: Secondary | ICD-10-CM | POA: Diagnosis not present

## 2019-09-08 DIAGNOSIS — M7712 Lateral epicondylitis, left elbow: Secondary | ICD-10-CM | POA: Insufficient documentation

## 2019-09-08 DIAGNOSIS — Z124 Encounter for screening for malignant neoplasm of cervix: Secondary | ICD-10-CM

## 2019-09-08 DIAGNOSIS — Z30011 Encounter for initial prescription of contraceptive pills: Secondary | ICD-10-CM | POA: Diagnosis not present

## 2019-09-08 MED ORDER — DROSPIRENONE-ETHINYL ESTRADIOL 3-0.03 MG PO TABS: 1.0000 | ORAL_TABLET | Freq: Every day | ORAL | 6 refills | Status: DC

## 2019-09-08 MED ORDER — ETODOLAC ER 400 MG PO TB24: 400.0000 mg | ORAL_TABLET | Freq: Every day | ORAL | 1 refills | Status: DC

## 2019-09-08 NOTE — Patient Instructions (Signed)
Tennis Elbow  Tennis elbow (lateral epicondylitis) is inflammation of tendons in your outer forearm, near your elbow. Tendons are tissues that connect muscle to bone. When you have tennis elbow, inflammation affects the tendons that you use to bend your wrist and move your hand up. Inflammation occurs in the lower part of the upper arm bone (humerus), where the tendons connect to the bone (lateral epicondyle). Tennis elbow often affects people who play tennis, but anyone may get the condition from repeatedly extending the wrist or turning the forearm. What are the causes? This condition is usually caused by repeatedly extending the wrist, turning the forearm, and using the hands. It can result from sports or work that requires repetitive forearm movements. In some cases, it may be caused by a sudden injury. What increases the risk? You are more likely to develop tennis elbow if you play tennis or another racket sport. You also have a higher risk if you frequently use your hands for work. Besides people who play tennis, others at greater risk include:  Musicians.  Carpenters, painters, and plumbers.  Cooks.  Cashiers.  People who work in factories.  Construction workers.  Butchers.  People who use computers. What are the signs or symptoms? Symptoms of this condition include:  Pain and tenderness in the forearm and the outer part of the elbow. Pain may be felt only when using the arm, or it may be there all the time.  A burning feeling that starts in the elbow and spreads down the forearm.  A weak grip in the hand. How is this diagnosed? This condition may be diagnosed based on:  Your symptoms and medical history.  A physical exam.  X-rays.  MRI. How is this treated? Resting and icing your arm is often the first treatment. Your health care provider may also recommend:  Medicines to reduce pain and inflammation. These may be in the form of a pill, topical gels, or shots of a  steroid medicine (cortisone).  An elbow strap to reduce stress on the area.  Physical therapy. This may include massage or exercises.  An elbow brace to restrict the movements that cause symptoms. If these treatments do not help relieve your symptoms, your health care provider may recommend surgery to remove damaged muscle and reattach healthy muscle to bone. Follow these instructions at home: Activity  Rest your elbow and wrist and avoid activities that cause symptoms, as told by your health care provider.  Do physical therapy exercises as instructed.  If you lift an object, lift it with your palm facing up. This reduces stress on your elbow. Lifestyle  If your tennis elbow is caused by sports, check your equipment and make sure that: ? You are using it correctly. ? It is the best fit for you.  If your tennis elbow is caused by work or computer use, take frequent breaks to stretch your arm. Talk with your manager about ways to manage your condition at work. If you have a brace:  Wear the brace or strap as told by your health care provider. Remove it only as told by your health care provider.  Loosen the brace if your fingers tingle, become numb, or turn cold and blue.  Keep the brace clean.  If the brace is not waterproof, ask if you may remove it for bathing. If you must keep the brace on while bathing: ? Do not let it get wet. ? Cover it with a watertight covering when you take a   bath or a shower. General instructions   If directed, put ice on the painful area: ? Put ice in a plastic bag. ? Place a towel between your skin and the bag. ? Leave the ice on for 20 minutes, 2-3 times a day.  Take over-the-counter and prescription medicines only as told by your health care provider.  Keep all follow-up visits as told by your health care provider. This is important. Contact a health care provider if:  You have pain that gets worse or does not get better with treatment.   You have numbness or weakness in your forearm, hand, or fingers. Summary  Tennis elbow (lateral epicondylitis) is inflammation of tendons in your outer forearm, near your elbow.  Common symptoms include pain and tenderness in your forearm and the outer part of your elbow.  This condition is usually caused by repeatedly extending your wrist, turning your forearm, and using your hands.  The first treatment is often resting and icing your arm to relieve symptoms. Further treatment may include taking medicine, getting physical therapy, wearing a brace or strap, or having surgery. This information is not intended to replace advice given to you by your health care provider. Make sure you discuss any questions you have with your health care provider. Document Released: 12/03/2005 Document Revised: 08/29/2018 Document Reviewed: 09/17/2017 Elsevier Patient Education  2020 Elsevier Inc.  

## 2019-09-08 NOTE — Progress Notes (Signed)
Subjective:  Patient ID: Erin Mays, female    DOB: [birth year]  Age: 38 y.o. MRN: [id number]  CC: Elbow Pain   HPI Alvera Mcpeek presents for f/up - She complains of a one-month history of left elbow pain that started after she did repetitive activities while gardening.  She denies any specific trauma or injury.  She has good range of motion in the elbow with no swelling.  He wants to restart an oral contraceptive.  Outpatient Medications Prior to Visit  Medication Sig Dispense Refill  . Calcium-Phosphorus-Vitamin D (CITRACAL +D3 PO) Take by mouth.    . Cholecalciferol (VITAMIN D-3) 25 MCG (1000 UT) CAPS Take by mouth.    . diphenhydrAMINE (BENADRYL) 25 MG tablet Take 25 mg by mouth every 6 (six) hours as needed.    . fluticasone (FLONASE) 50 MCG/ACT nasal spray Place 2 sprays into both nostrils daily. 16 g 6  . loratadine (CLARITIN) 10 MG tablet Take 10 mg by mouth as needed.     . sertraline (ZOLOFT) 100 MG tablet TAKE 1 TABLET BY MOUTH EVERY DAY 90 tablet 1  . vitamin k 100 MCG tablet Take 100 mcg by mouth daily.    . benzonatate (TESSALON) 200 MG capsule Take 1 capsule (200 mg total) by mouth 3 (three) times daily as needed. (Patient not taking: Reported on 04/11/2019) 60 capsule 0  . clonazePAM (KLONOPIN) 1 MG tablet TAKE 1 TABLET (1 MG TOTAL) BY MOUTH 2 (TWO) TIMES DAILY AS NEEDED FOR ANXIETY 60 tablet 1  . doxycycline (VIBRA-TABS) 100 MG tablet Take 1 tablet (100 mg total) by mouth 2 (two) times daily. (Patient not taking: Reported on 04/11/2019) 20 tablet 0  . predniSONE (DELTASONE) 20 MG tablet Take 1 tablet (20 mg total) by mouth daily with breakfast. (Patient not taking: Reported on 04/11/2019) 5 tablet 0   No facility-administered medications prior to visit.     ROS Review of Systems  Musculoskeletal: Positive for arthralgias.  All other systems reviewed and are negative.   Objective:  BP 130/80 (BP Location: Left Arm, Patient Position: Sitting, Cuff Size:  Normal)   Pulse 68   Temp 98.2 F (36.8 C) (Oral)   Ht 5\' 6"  (1.676 m)   Wt 142 lb (64.4 kg)   LMP 09/03/2019   SpO2 99%   BMI 22.92 kg/m   BP Readings from Last 3 Encounters:  09/08/19 130/80  04/11/19 116/79  02/27/19 110/68    Wt Readings from Last 3 Encounters:  09/08/19 142 lb (64.4 kg)  02/27/19 142 lb 1.9 oz (64.5 kg)  02/19/19 147 lb (66.7 kg)    Physical Exam Vitals signs reviewed.  Constitutional:      Appearance: Normal appearance.  HENT:     Nose: Nose normal.     Mouth/Throat:     Mouth: Mucous membranes are moist.  Eyes:     General: No scleral icterus.    Conjunctiva/sclera: Conjunctivae normal.  Neck:     Musculoskeletal: Normal range of motion and neck supple.  Cardiovascular:     Rate and Rhythm: Normal rate and regular rhythm.     Heart sounds: No murmur.  Pulmonary:     Effort: Pulmonary effort is normal.     Breath sounds: No wheezing, rhonchi or rales.  Abdominal:     General: Abdomen is flat.     Palpations: There is no splenomegaly.  Musculoskeletal: Normal range of motion.        General: No swelling.  Left elbow: She exhibits normal range of motion, no swelling and no deformity. Tenderness found. Lateral epicondyle tenderness noted. No radial head and no medial epicondyle tenderness noted.     Right lower leg: No edema.     Left lower leg: No edema.  Skin:    General: Skin is warm and dry.  Neurological:     General: No focal deficit present.     Mental Status: She is alert.     Lab Results  Component Value Date   WBC 7.3 07/06/2016   HGB 13.4 07/06/2016   HCT 43 07/06/2016   PLT 319 07/06/2016   GLUCOSE 83 04/14/2015   CHOL 216 (H) 11/29/2017   TRIG 85.0 11/29/2017   HDL 82.60 11/29/2017   LDLDIRECT 147.9 10/29/2013   LDLCALC 116 (H) 11/29/2017   ALT 8 07/06/2016   AST 14 07/06/2016   NA 142 07/06/2016   K 4.6 07/06/2016   CL 103 04/14/2015   CREATININE 0.7 07/06/2016   BUN 14 07/06/2016   CO2 27 04/14/2015    TSH 1.70 11/29/2017    Dg Ankle Complete Left  Result Date: 04/11/2019 CLINICAL DATA:  Twisting injury. EXAM: LEFT ANKLE COMPLETE - 3+ VIEW COMPARISON:  None. FINDINGS: There is lateral malleolar soft tissue swelling. There is a fractures through the distal fibular metaphysis without significant displacement. The ankle mortise is intact. A crescent shaped 3.6 mm region of high attenuation is seen along the bottom of the foot on the lateral view at the level of the metatarsal bases. IMPRESSION: 1. Distal fibular fracture with associated soft tissue swelling. 2. The 4 mm crescent-shaped focus of high attenuation along the bottom of the foot on the lateral view at the level of the metatarsal bases could represent a soft tissue calcification or and age indeterminate foreign body. Recommend clinical correlation. Electronically Signed   By: Gerome Sam III M.D   On: 04/11/2019 11:53    Assessment & Plan:   Nyoka Lint was seen today for elbow pain.  Diagnoses and all orders for this visit:  Need for influenza vaccination -     Flu Vaccine QUAD 36+ mos IM  Encounter for initial prescription of contraceptive pills -     drospirenone-ethinyl estradiol (YASMIN) 3-0.03 MG tablet; Take 1 tablet by mouth daily.  Lateral epicondylitis of left elbow- She will rest, ice, and start taking a daily anti-inflammatory.  If she does not get symptom relief soon then I will refer her for therapy. -     etodolac (LODINE XL) 400 MG 24 hr tablet; Take 1 tablet (400 mg total) by mouth daily.  Cervical cancer screening -     Ambulatory referral to Gynecology   I have discontinued Laketra Guise's benzonatate, doxycycline, predniSONE, and clonazePAM. I am also having her start on drospirenone-ethinyl estradiol and etodolac. Additionally, I am having her maintain her loratadine, fluticasone, sertraline, Calcium-Phosphorus-Vitamin D (CITRACAL +D3 PO), Vitamin D-3, diphenhydrAMINE, and vitamin k.  Meds ordered this  encounter  Medications  . drospirenone-ethinyl estradiol (YASMIN) 3-0.03 MG tablet    Sig: Take 1 tablet by mouth daily.    Dispense:  1 Package    Refill:  6  . etodolac (LODINE XL) 400 MG 24 hr tablet    Sig: Take 1 tablet (400 mg total) by mouth daily.    Dispense:  90 tablet    Refill:  1     Follow-up: Return if symptoms worsen or fail to improve.  Sanda Linger, MD

## 2019-10-05 ENCOUNTER — Telehealth: Payer: Self-pay | Admitting: *Deleted

## 2019-10-05 NOTE — Telephone Encounter (Signed)
Copied from [place] [id number]. Topic: General - Other >> Oct 05, 2019 10:18 AM Percell Belt A wrote: Reason for CRM: pt called in and stated that the etodolac (LODINE XL) 400 MG 24 hr tablet [[id number]] was over $100 and she stated she did not wanted to pay that much for a med.  She would like to know if there is anything else that could be called in ?   Pharmacy - [facility] in [place] on [place]

## 2019-10-06 MED ORDER — IBUPROFEN 600 MG PO TABS: 600.0000 mg | ORAL_TABLET | Freq: Three times a day (TID) | ORAL | 0 refills | Status: DC | PRN

## 2019-10-06 NOTE — Telephone Encounter (Signed)
MD is out of the office this week. Pls advise on msg below../lmb 

## 2019-10-06 NOTE — Telephone Encounter (Signed)
[name] -pt w/Md response. Pt is wanting script to be sent to pharmacy to be able to use her flex spending card.Erin KitchenJohny Chess

## 2019-10-06 NOTE — Telephone Encounter (Signed)
Sent in

## 2019-10-06 NOTE — Telephone Encounter (Signed)
Can take ibuprofen 600 mg TID over the counter instead.

## 2019-10-07 NOTE — Telephone Encounter (Signed)
lvm for pt informing rx has been sent in.  

## 2019-10-19 ENCOUNTER — Other Ambulatory Visit: Payer: Self-pay

## 2019-10-20 ENCOUNTER — Ambulatory Visit: Payer: Self-pay | Admitting: Women's Health

## 2019-12-04 ENCOUNTER — Other Ambulatory Visit: Payer: Self-pay | Admitting: Internal Medicine

## 2019-12-04 DIAGNOSIS — F418 Other specified anxiety disorders: Secondary | ICD-10-CM

## 2019-12-08 ENCOUNTER — Ambulatory Visit: Payer: Self-pay | Admitting: Women's Health

## 2020-01-18 ENCOUNTER — Other Ambulatory Visit: Payer: Self-pay

## 2020-01-19 ENCOUNTER — Ambulatory Visit (INDEPENDENT_AMBULATORY_CARE_PROVIDER_SITE_OTHER): Payer: BC Managed Care – PPO | Admitting: Women's Health

## 2020-01-19 ENCOUNTER — Encounter: Payer: Self-pay | Admitting: Women's Health

## 2020-01-19 VITALS — BP 118/80 | Ht 66.0 in | Wt 150.0 lb

## 2020-01-19 DIAGNOSIS — Z01419 Encounter for gynecological examination (general) (routine) without abnormal findings: Secondary | ICD-10-CM | POA: Diagnosis not present

## 2020-01-19 DIAGNOSIS — Z113 Encounter for screening for infections with a predominantly sexual mode of transmission: Secondary | ICD-10-CM

## 2020-01-19 LAB — HM PAP SMEAR

## 2020-01-19 NOTE — Patient Instructions (Addendum)
Lovely to meet you today! Health Maintenance, Female Adopting a healthy lifestyle and getting preventive care are important in promoting health and wellness. Ask your health care provider about:  The right schedule for you to have regular tests and exams.  Things you can do on your own to prevent diseases and keep yourself healthy. What should I know about diet, weight, and exercise? Eat a healthy diet   Eat a diet that includes plenty of vegetables, fruits, low-fat dairy products, and lean protein.  Do not eat a lot of foods that are high in solid fats, added sugars, or sodium. Maintain a healthy weight Body mass index (BMI) is used to identify weight problems. It estimates body fat based on height and weight. Your health care provider can help determine your BMI and help you achieve or maintain a healthy weight. Get regular exercise Get regular exercise. This is one of the most important things you can do for your health. Most adults should:  Exercise for at least 150 minutes each week. The exercise should increase your heart rate and make you sweat (moderate-intensity exercise).  Do strengthening exercises at least twice a week. This is in addition to the moderate-intensity exercise.  Spend less time sitting. Even light physical activity can be beneficial. Watch cholesterol and blood lipids Have your blood tested for lipids and cholesterol at 39 years of age, then have this test every 5 years. Have your cholesterol levels checked more often if:  Your lipid or cholesterol levels are high.  You are older than 39 years of age.  You are at high risk for heart disease. What should I know about cancer screening? Depending on your health history and family history, you may need to have cancer screening at various ages. This may include screening for:  Breast cancer.  Cervical cancer.  Colorectal cancer.  Skin cancer.  Lung cancer. What should I know about heart disease,  diabetes, and high blood pressure? Blood pressure and heart disease  High blood pressure causes heart disease and increases the risk of stroke. This is more likely to develop in people who have high blood pressure readings, are of African descent, or are overweight.  Have your blood pressure checked: ? Every 3-5 years if you are 39-57 years of age. ? Every year if you are 63 years old or older. Diabetes Have regular diabetes screenings. This checks your fasting blood sugar level. Have the screening done:  Once every three years after age 39 if you are at a normal weight and have a low risk for diabetes.  More often and at a younger age if you are overweight or have a high risk for diabetes. What should I know about preventing infection? Hepatitis B If you have a higher risk for hepatitis B, you should be screened for this virus. Talk with your health care provider to find out if you are at risk for hepatitis B infection. Hepatitis C Testing is recommended for:  Everyone born from [birth year] through 1965.  Anyone with known risk factors for hepatitis C. Sexually transmitted infections (STIs)  Get screened for STIs, including gonorrhea and chlamydia, if: ? You are sexually active and are younger than 39 years of age. ? You are older than 39 years of age and your health care provider tells you that you are at risk for this type of infection. ? Your sexual activity has changed since you were last screened, and you are at increased risk for chlamydia or gonorrhea. Ask  gonorrhea. Ask your health care provider if you are at risk.  Ask your health care provider about whether you are at high risk for HIV. Your health care provider may recommend a prescription medicine to help prevent HIV infection. If you choose to take medicine to prevent HIV, you should first get tested for HIV. You should then be tested every 3 months for as long as you are taking the medicine. Pregnancy  If you are about to stop having your  period (premenopausal) and you may become pregnant, seek counseling before you get pregnant.  Take 400 to 800 micrograms (mcg) of folic acid every day if you become pregnant.  Ask for birth control (contraception) if you want to prevent pregnancy. Osteoporosis and menopause Osteoporosis is a disease in which the bones lose minerals and strength with aging. This can result in bone fractures. If you are 39 years old or older, or if you are at risk for osteoporosis and fractures, ask your health care provider if you should:  Be screened for bone loss.  Take a calcium or vitamin D supplement to lower your risk of fractures.  Be given hormone replacement therapy (HRT) to treat symptoms of menopause. Follow these instructions at home: Lifestyle  Do not use any products that contain nicotine or tobacco, such as cigarettes, e-cigarettes, and chewing tobacco. If you need help quitting, ask your health care provider.  Do not use street drugs.  Do not share needles.  Ask your health care provider for help if you need support or information about quitting drugs. Alcohol use  Do not drink alcohol if: ? Your health care provider tells you not to drink. ? You are pregnant, may be pregnant, or are planning to become pregnant.  If you drink alcohol: ? Limit how much you use to 0-1 drink a day. ? Limit intake if you are breastfeeding.  Be aware of how much alcohol is in your drink. In the [place] one drink equals one 12 oz bottle of beer (355 mL), one 5 oz glass of wine (148 mL), or one 1 oz glass of hard liquor (44 mL). General instructions  Schedule regular health, dental, and eye exams.  Stay current with your vaccines.  Tell your health care provider if: ? You often feel depressed. ? You have ever been abused or do not feel safe at home. Summary  Adopting a healthy lifestyle and getting preventive care are important in promoting health and wellness.  Follow your health care provider's  instructions about healthy diet, exercising, and getting tested or screened for diseases.  Follow your health care provider's instructions on monitoring your cholesterol and blood pressure. This information is not intended to replace advice given to you by your health care provider. Make sure you discuss any questions you have with your health care provider. Document Revised: 11/26/2018 Document Reviewed: 11/26/2018 Elsevier Patient Education  2020 Elsevier Inc.  

## 2020-01-19 NOTE — Progress Notes (Signed)
Erin Mays 19-Jul-1981 [id number]    History:    Presents for annual exam.  Regular monthly cycle for intercourse partner bipolar disease/withdrawal.  Same partner 15 years request cultures.  Reports normal Pap history.  Primary care manages labs.  Gardasil series completed.  History of HSV rare outbreaks.  Anxiety/depression stable on Zoloft per primary care.  Past medical history, past surgical history, family history and social history were all reviewed and documented in the EPIC chart.  Works at [employer], originally from [place].  ROS:  A ROS was performed and pertinent positives and negatives are included.  Exam:  Vitals:   01/19/20 1159  BP: 118/80  Weight: 150 lb (68 kg)  Height: 5\' 6"  (1.676 m)   Body mass index is 24.21 kg/m.   General appearance:  Normal Thyroid:  Symmetrical, normal in size, without palpable masses or nodularity. Respiratory  Auscultation:  Clear without wheezing or rhonchi Cardiovascular  Auscultation:  Regular rate, without rubs, murmurs or gallops  Edema/varicosities:  Not grossly evident Abdominal  Soft,nontender, without masses, guarding or rebound.  Liver/spleen:  No organomegaly noted  Hernia:  None appreciated  Skin  Inspection:  Grossly normal   Breasts: Examined lying and sitting.     Right: Without masses, retractions, discharge or axillary adenopathy.     Left: Without masses, retractions, discharge or axillary adenopathy. Gentitourinary   Inguinal/mons:  Normal without inguinal adenopathy  External genitalia:  Normal  BUS/Urethra/Skene's glands:  Normal  Vagina:  Normal  Cervix:  Normal  Uterus:  normal in size, shape and contour.  Midline and mobile  Adnexa/parametria:     Rt: Without masses or tenderness.   Lt: Without masses or tenderness.  Anus and perineum: Normal  Digital rectal exam: Normal sphincter tone without palpated masses or tenderness  Assessment/Plan:  39 y.o. S WF G0 for annual exam with no GYN  complaints.  Regular monthly cycle/rare sexual activity/withdrawal Anxiety/depression, asthma-primary care manages labs and meds  Plan: Contraception reviewed and declined.  Counseling encouraged.  SBEs, annual screening mammogram at 40, calcium rich foods, MVI daily encouraged.  Reviewed importance of regular exercise, leisure activities and self-care.  Pap with high risk HPV, GC/chlamydia, declines need for HIV or RPR. 09-06-1984 [facility], 1:34 PM 01/19/2020

## 2020-01-20 LAB — PAP IG, CT-NG NAA, HPV HIGH-RISK
C. trachomatis RNA, TMA: NOT DETECTED
HPV DNA High Risk: NOT DETECTED
N. gonorrhoeae RNA, TMA: NOT DETECTED

## 2020-02-23 ENCOUNTER — Other Ambulatory Visit: Payer: Self-pay | Admitting: Internal Medicine

## 2020-02-23 DIAGNOSIS — Z30011 Encounter for initial prescription of contraceptive pills: Secondary | ICD-10-CM

## 2020-05-20 ENCOUNTER — Other Ambulatory Visit: Payer: Self-pay | Admitting: Family

## 2020-05-27 ENCOUNTER — Other Ambulatory Visit: Payer: Self-pay | Admitting: Internal Medicine

## 2020-05-27 DIAGNOSIS — F418 Other specified anxiety disorders: Secondary | ICD-10-CM

## 2020-10-27 ENCOUNTER — Other Ambulatory Visit: Payer: Self-pay

## 2020-10-27 ENCOUNTER — Encounter: Payer: BC Managed Care – PPO | Admitting: Internal Medicine

## 2020-10-27 ENCOUNTER — Ambulatory Visit (INDEPENDENT_AMBULATORY_CARE_PROVIDER_SITE_OTHER): Payer: BC Managed Care – PPO | Admitting: Internal Medicine

## 2020-10-27 ENCOUNTER — Encounter: Payer: Self-pay | Admitting: Internal Medicine

## 2020-10-27 VITALS — BP 122/74 | HR 64 | Temp 98.1°F | Resp 16 | Ht 66.0 in | Wt 148.0 lb

## 2020-10-27 DIAGNOSIS — F5104 Psychophysiologic insomnia: Secondary | ICD-10-CM | POA: Diagnosis not present

## 2020-10-27 DIAGNOSIS — Z0000 Encounter for general adult medical examination without abnormal findings: Secondary | ICD-10-CM | POA: Diagnosis not present

## 2020-10-27 MED ORDER — DAYVIGO 5 MG PO TABS: 1.0000 | ORAL_TABLET | Freq: Every evening | ORAL | 0 refills | Status: DC | PRN

## 2020-10-27 NOTE — Patient Instructions (Signed)

## 2020-10-27 NOTE — Progress Notes (Signed)
Subjective:  Patient ID: Erin Mays, female    DOB: 10-19-1981  Age: 39 y.o. MRN: [id number]  CC: Annual Exam  This visit occurred during the SARS-CoV-2 public health emergency.  Safety protocols were in place, including screening questions prior to the visit, additional usage of staff PPE, and extensive cleaning of exam room while observing appropriate contact time as indicated for disinfecting solutions.    HPI Erin Mays presents for a CPX.  She complains of chronic insomnia with difficulty falling asleep.  She has intermittently taken her boyfriend's Ambien but she worries that it may cause abnormal behavior.  She was previously treated with benzos but she would rather avoid a benzodiazepine.  She is not sexually active.  She feels well during the day with no signs of depression, anxiety, or panic.  Outpatient Medications Prior to Visit  Medication Sig Dispense Refill  . Cholecalciferol (VITAMIN D-3) 25 MCG (1000 UT) CAPS Take by mouth.    . diphenhydrAMINE (BENADRYL) 25 MG tablet Take 25 mg by mouth every 6 (six) hours as needed.    . fluticasone (FLONASE) 50 MCG/ACT nasal spray SPRAY 2 SPRAYS INTO EACH NOSTRIL EVERY DAY 48 mL 2  . loratadine (CLARITIN) 10 MG tablet Take 10 mg by mouth as needed.     . sertraline (ZOLOFT) 100 MG tablet TAKE 1 TABLET BY MOUTH EVERY DAY 90 tablet 1  . Calcium-Phosphorus-Vitamin D (CITRACAL +D3 PO) Take by mouth.    . vitamin k 100 MCG tablet Take 100 mcg by mouth daily.    Marland Kitchen ibuprofen (ADVIL) 600 MG tablet Take 1 tablet (600 mg total) by mouth every 8 (eight) hours as needed. 30 tablet 0   No facility-administered medications prior to visit.    ROS Review of Systems  Constitutional: Negative for appetite change, diaphoresis and fatigue.  HENT: Negative.   Eyes: Negative for visual disturbance.  Respiratory: Negative for cough, chest tightness, shortness of breath and wheezing.   Cardiovascular: Negative for chest pain,  palpitations and leg swelling.  Gastrointestinal: Negative for abdominal pain, constipation, diarrhea, nausea and vomiting.  Endocrine: Negative.   Genitourinary: Negative.  Negative for difficulty urinating.  Musculoskeletal: Negative.  Negative for arthralgias.  Skin: Negative.   Neurological: Negative.  Negative for dizziness, weakness, light-headedness and headaches.  Hematological: Negative for adenopathy. Does not bruise/bleed easily.  Psychiatric/Behavioral: Positive for sleep disturbance. Negative for behavioral problems, confusion, decreased concentration, dysphoric mood and suicidal ideas. The patient is not nervous/anxious.     Objective:  BP 122/74   Pulse 64   Temp 98.1 F (36.7 C) (Oral)   Resp 16   Ht 5\' 6"  (1.676 m)   Wt 148 lb (67.1 kg)   LMP 10/19/2020   SpO2 96%   BMI 23.89 kg/m   BP Readings from Last 3 Encounters:  10/27/20 122/74  01/19/20 118/80  09/08/19 130/80    Wt Readings from Last 3 Encounters:  10/27/20 148 lb (67.1 kg)  01/19/20 150 lb (68 kg)  09/08/19 142 lb (64.4 kg)    Physical Exam Vitals reviewed.  Constitutional:      Appearance: Normal appearance.  HENT:     Nose: Nose normal.     Mouth/Throat:     Mouth: Mucous membranes are moist.  Eyes:     General: No scleral icterus.    Conjunctiva/sclera: Conjunctivae normal.  Cardiovascular:     Rate and Rhythm: Normal rate and regular rhythm.     Heart sounds: No murmur heard.  Pulmonary:     Effort: Pulmonary effort is normal.     Breath sounds: No stridor. No wheezing, rhonchi or rales.  Abdominal:     General: Abdomen is flat.     Palpations: There is no mass.     Tenderness: There is no abdominal tenderness.  Musculoskeletal:        General: Normal range of motion.     Cervical back: Neck supple.     Right lower leg: No edema.     Left lower leg: No edema.  Lymphadenopathy:     Cervical: No cervical adenopathy.  Skin:    General: Skin is warm and dry.      Coloration: Skin is not pale.  Neurological:     General: No focal deficit present.     Mental Status: She is alert and oriented to person, place, and time. Mental status is at baseline.  Psychiatric:        Mood and Affect: Mood normal.        Behavior: Behavior normal.        Thought Content: Thought content normal.        Judgment: Judgment normal.     Lab Results  Component Value Date   WBC 7.3 07/06/2016   HGB 13.4 07/06/2016   HCT 43 07/06/2016   PLT 319 07/06/2016   GLUCOSE 83 04/14/2015   CHOL 216 (H) 11/29/2017   TRIG 85.0 11/29/2017   HDL 82.60 11/29/2017   LDLDIRECT 147.9 10/29/2013   LDLCALC 116 (H) 11/29/2017   ALT 8 07/06/2016   AST 14 07/06/2016   NA 142 07/06/2016   K 4.6 07/06/2016   CL 103 04/14/2015   CREATININE 0.7 07/06/2016   BUN 14 07/06/2016   CO2 27 04/14/2015   TSH 1.70 11/29/2017  `  DG Ankle Complete Left  Result Date: 04/11/2019 CLINICAL DATA:  Twisting injury. EXAM: LEFT ANKLE COMPLETE - 3+ VIEW COMPARISON:  None. FINDINGS: There is lateral malleolar soft tissue swelling. There is a fractures through the distal fibular metaphysis without significant displacement. The ankle mortise is intact. A crescent shaped 3.6 mm region of high attenuation is seen along the bottom of the foot on the lateral view at the level of the metatarsal bases. IMPRESSION: 1. Distal fibular fracture with associated soft tissue swelling. 2. The 4 mm crescent-shaped focus of high attenuation along the bottom of the foot on the lateral view at the level of the metatarsal bases could represent a soft tissue calcification or and age indeterminate foreign body. Recommend clinical correlation. Electronically Signed   By: Gerome Sam III M.D   On: 04/11/2019 11:53    Assessment & Plan:   Erin Mays was seen today for annual exam.  Diagnoses and all orders for this visit:  Psychophysiological insomnia- I recommended that she treat this with an orexin blocker. -      Lemborexant (DAYVIGO) 5 MG TABS; Take 1 tablet by mouth at bedtime as needed.  Routine general medical examination at a health care facility- Exam completed, no labs are indicated, vaccines reviewed and updated, cancer screenings are up-to-date, patient education was given.   I have discontinued Kresta Mcdougle's Calcium-Phosphorus-Vitamin D (CITRACAL +D3 PO), vitamin k, and ibuprofen. I am also having her start on DayVigo. Additionally, I am having her maintain her loratadine, Vitamin D-3, diphenhydrAMINE, fluticasone, and sertraline.  Meds ordered this encounter  Medications  . Lemborexant (DAYVIGO) 5 MG TABS    Sig: Take 1 tablet by mouth at bedtime  as needed.    Dispense:  10 tablet    Refill:  0     Follow-up: Return in about 6 months (around 04/26/2021).  Sanda Linger, MD

## 2020-11-07 ENCOUNTER — Other Ambulatory Visit: Payer: Self-pay | Admitting: Internal Medicine

## 2020-11-07 DIAGNOSIS — F5104 Psychophysiologic insomnia: Secondary | ICD-10-CM

## 2020-11-07 DIAGNOSIS — F411 Generalized anxiety disorder: Secondary | ICD-10-CM

## 2020-11-07 MED ORDER — LOREEV XR 1 MG PO CS24: 1.0000 | EXTENDED_RELEASE_CAPSULE | Freq: Every day | ORAL | 1 refills | Status: DC | PRN

## 2020-11-07 MED ORDER — ALPRAZOLAM 0.5 MG PO TABS: 0.5000 mg | ORAL_TABLET | Freq: Two times a day (BID) | ORAL | 1 refills | Status: DC | PRN

## 2020-11-07 NOTE — Telephone Encounter (Signed)
Pt has been informed that Rx was sent and a copay was available. She stated that she would like a generic rx sent it due to affordability. Please advise.

## 2020-11-07 NOTE — Telephone Encounter (Signed)
Patient states during her last appointment Dr. Yetta Barre gave her samples for dayvigo and the 5 and the 10 did not help her sleep and she was wondering if she can be prescribed xanax again for this issue.

## 2021-01-06 DIAGNOSIS — M25562 Pain in left knee: Secondary | ICD-10-CM | POA: Diagnosis not present

## 2021-01-18 DIAGNOSIS — M6281 Muscle weakness (generalized): Secondary | ICD-10-CM | POA: Diagnosis not present

## 2021-01-18 DIAGNOSIS — M25662 Stiffness of left knee, not elsewhere classified: Secondary | ICD-10-CM | POA: Diagnosis not present

## 2021-01-18 DIAGNOSIS — M7632 Iliotibial band syndrome, left leg: Secondary | ICD-10-CM | POA: Diagnosis not present

## 2021-01-19 ENCOUNTER — Other Ambulatory Visit: Payer: Self-pay

## 2021-01-19 ENCOUNTER — Ambulatory Visit: Payer: BC Managed Care – PPO | Admitting: Nurse Practitioner

## 2021-01-19 ENCOUNTER — Encounter: Payer: Self-pay | Admitting: Nurse Practitioner

## 2021-01-19 VITALS — BP 112/70 | HR 62 | Resp 16 | Ht 65.5 in | Wt 148.6 lb

## 2021-01-19 DIAGNOSIS — Z01419 Encounter for gynecological examination (general) (routine) without abnormal findings: Secondary | ICD-10-CM | POA: Diagnosis not present

## 2021-01-19 NOTE — Patient Instructions (Addendum)
[facility] [phone] [address]  [place], [place] [address]  Health Maintenance, Female Adopting a healthy lifestyle and getting preventive care are important in promoting health and wellness. Ask your health care provider about:  The right schedule for you to have regular tests and exams.  Things you can do on your own to prevent diseases and keep yourself healthy. What should I know about diet, weight, and exercise? Eat a healthy diet  Eat a diet that includes plenty of vegetables, fruits, low-fat dairy products, and lean protein.  Do not eat a lot of foods that are high in solid fats, added sugars, or sodium.   Maintain a healthy weight Body mass index (BMI) is used to identify weight problems. It estimates body fat based on height and weight. Your health care provider can help determine your BMI and help you achieve or maintain a healthy weight. Get regular exercise Get regular exercise. This is one of the most important things you can do for your health. Most adults should:  Exercise for at least 150 minutes each week. The exercise should increase your heart rate and make you sweat (moderate-intensity exercise).  Do strengthening exercises at least twice a week. This is in addition to the moderate-intensity exercise.  Spend less time sitting. Even light physical activity can be beneficial. Watch cholesterol and blood lipids Have your blood tested for lipids and cholesterol at 40 years of age, then have this test every 5 years. Have your cholesterol levels checked more often if:  Your lipid or cholesterol levels are high.  You are older than 40 years of age.  You are at high risk for heart disease. What should I know about cancer screening? Depending on your health history and family history, you may need to have cancer screening at various ages. This may include screening for:  Breast cancer.  Cervical cancer.  Colorectal  cancer.  Skin cancer.  Lung cancer. What should I know about heart disease, diabetes, and high blood pressure? Blood pressure and heart disease  High blood pressure causes heart disease and increases the risk of stroke. This is more likely to develop in people who have high blood pressure readings, are of African descent, or are overweight.  Have your blood pressure checked: ? Every 3-5 years if you are 18-39 years of age. ? Every year if you are 40 years old or older. Diabetes Have regular diabetes screenings. This checks your fasting blood sugar level. Have the screening done:  Once every three years after age 40 if you are at a normal weight and have a low risk for diabetes.  More often and at a younger age if you are overweight or have a high risk for diabetes. What should I know about preventing infection? Hepatitis B If you have a higher risk for hepatitis B, you should be screened for this virus. Talk with your health care provider to find out if you are at risk for hepatitis B infection. Hepatitis C Testing is recommended for:  Everyone born from 1945 through 1965.  Anyone with known risk factors for hepatitis C. Sexually transmitted infections (STIs)  Get screened for STIs, including gonorrhea and chlamydia, if: ? You are sexually active and are younger than 40 years of age. ? You are older than 40 years of age and your health care provider tells you that you are at risk for this type of infection. ? Your sexual activity has changed since you were   last screened, and you are at increased risk for chlamydia or gonorrhea. Ask your health care provider if you are at risk.  Ask your health care provider about whether you are at high risk for HIV. Your health care provider may recommend a prescription medicine to help prevent HIV infection. If you choose to take medicine to prevent HIV, you should first get tested for HIV. You should then be tested every 3 months for as long as  you are taking the medicine. Pregnancy  If you are about to stop having your period (premenopausal) and you may become pregnant, seek counseling before you get pregnant.  Take 400 to 800 micrograms (mcg) of folic acid every day if you become pregnant.  Ask for birth control (contraception) if you want to prevent pregnancy. Osteoporosis and menopause Osteoporosis is a disease in which the bones lose minerals and strength with aging. This can result in bone fractures. If you are 53 years old or older, or if you are at risk for osteoporosis and fractures, ask your health care provider if you should:  Be screened for bone loss.  Take a calcium or vitamin D supplement to lower your risk of fractures.  Be given hormone replacement therapy (HRT) to treat symptoms of menopause. Follow these instructions at home: Lifestyle  Do not use any products that contain nicotine or tobacco, such as cigarettes, e-cigarettes, and chewing tobacco. If you need help quitting, ask your health care provider.  Do not use street drugs.  Do not share needles.  Ask your health care provider for help if you need support or information about quitting drugs. Alcohol use  Do not drink alcohol if: ? Your health care provider tells you not to drink. ? You are pregnant, may be pregnant, or are planning to become pregnant.  If you drink alcohol: ? Limit how much you use to 0-1 drink a day. ? Limit intake if you are breastfeeding.  Be aware of how much alcohol is in your drink. In the [place] one drink equals one 12 oz bottle of beer (355 mL), one 5 oz glass of wine (148 mL), or one 1 oz glass of hard liquor (44 mL). General instructions  Schedule regular health, dental, and eye exams.  Stay current with your vaccines.  Tell your health care provider if: ? You often feel depressed. ? You have ever been abused or do not feel safe at home. Summary  Adopting a healthy lifestyle and getting preventive care are  important in promoting health and wellness.  Follow your health care provider's instructions about healthy diet, exercising, and getting tested or screened for diseases.  Follow your health care provider's instructions on monitoring your cholesterol and blood pressure. This information is not intended to replace advice given to you by your health care provider. Make sure you discuss any questions you have with your health care provider. Document Revised: 11/26/2018 Document Reviewed: 11/26/2018 Elsevier Patient Education  2021 [employer].

## 2021-01-19 NOTE — Progress Notes (Signed)
   Erin Mays [birth year] [id number]   History:  40 y.o. G0 presents for annual exam without GYN complaints. Mostly regular monthly cycles that range from 21-28 days with light bleeding. Normal pap history. Anxiety and depression managed by PCP.   Gynecologic History Patient's last menstrual period was 01/15/2021 (exact date). Period Cycle (Days): 24 Period Duration (Days): 7 Period Pattern: Regular Menstrual Flow: Moderate Menstrual Control: Panty liner Menstrual Control Change Freq (Hours): 8 Dysmenorrhea: (!) Mild Dysmenorrhea Symptoms: Other (Comment),Headache (emotional) Contraception/Family planning: rhythm method  Health Maintenance Last Pap: 01/19/2020. Results were: normal Last mammogram: N/A Last colonoscopy: N/A Last Dexa: N/A  Past medical history, past surgical history, family history and social history were all reviewed and documented in the EPIC chart. Works in Audiological scientist at [employer].   ROS:  A ROS was performed and pertinent positives and negatives are included.  Exam:  Vitals:   01/19/21 1347  BP: 112/70  Pulse: 62  Resp: 16  Weight: 148 lb 9.6 oz (67.4 kg)  Height: 5' 5.5" (1.664 m)   Body mass index is 24.35 kg/m.  General appearance:  Normal Thyroid:  Symmetrical, normal in size, without palpable masses or nodularity. Respiratory  Auscultation:  Clear without wheezing or rhonchi Cardiovascular  Auscultation:  Regular rate, without rubs, murmurs or gallops  Edema/varicosities:  Not grossly evident Abdominal  Soft,nontender, without masses, guarding or rebound.  Liver/spleen:  No organomegaly noted  Hernia:  None appreciated  Skin  Inspection:  Grossly normal   Breasts: Examined lying and sitting.   Right: Without masses, retractions, discharge or axillary adenopathy.   Left: Without masses, retractions, discharge or axillary adenopathy. Gentitourinary   Inguinal/mons:  Normal without inguinal adenopathy  External genitalia:   Normal  BUS/Urethra/Skene's glands:  Normal  Vagina:  Normal  Cervix:  Normal  Uterus:  Normal in size, shape and contour.  Midline and mobile  Adnexa/parametria:     Rt: Without masses or tenderness.   Lt: Without masses or tenderness.  Anus and perineum: Normal  Assessment/Plan:  40 y.o. G0 for annual exam.   Well female exam with routine gynecological exam - Education provided on SBEs, importance of preventative screenings, current guidelines, high calcium diet, regular exercise, and multivitamin daily. Labs with PCP.  Screening for cervical cancer - Normal Pap history.  Will repeat at 5-year interval per guidelines.  Screening for breast cancer - Discussed current guidelines and recommendations to start mammograms at age 40. She thinks her work provides mobile screenings but I provided her with information on the [facility] if not. Normal breast exam today.  Follow up in 1 year for annual.     Olivia Mackie Chi [facility], 2:04 PM 01/19/2021

## 2021-01-23 ENCOUNTER — Encounter: Payer: BC Managed Care – PPO | Admitting: Nurse Practitioner

## 2021-03-22 DIAGNOSIS — Z1231 Encounter for screening mammogram for malignant neoplasm of breast: Secondary | ICD-10-CM | POA: Diagnosis not present

## 2021-04-11 ENCOUNTER — Telehealth: Payer: Self-pay | Admitting: Internal Medicine

## 2021-04-11 NOTE — Telephone Encounter (Signed)
Patient wondering if there was a different anti-depressant Dr. Yetta Barre would recommend for her. States she feels like the sertraline (ZOLOFT) 100 MG tablet isn't working as well as it used to. Denies apt at this time

## 2021-04-13 NOTE — Telephone Encounter (Signed)
I have provided the pt the below info.

## 2021-04-13 NOTE — Telephone Encounter (Signed)
Refer her to  [facility] [phone] [facility] [address] [phone]

## 2021-04-26 DIAGNOSIS — F39 Unspecified mood [affective] disorder: Secondary | ICD-10-CM | POA: Diagnosis not present

## 2021-05-12 DIAGNOSIS — L718 Other rosacea: Secondary | ICD-10-CM | POA: Diagnosis not present

## 2021-05-16 ENCOUNTER — Other Ambulatory Visit: Payer: Self-pay | Admitting: Internal Medicine

## 2021-05-16 DIAGNOSIS — F411 Generalized anxiety disorder: Secondary | ICD-10-CM

## 2021-05-16 DIAGNOSIS — F5104 Psychophysiologic insomnia: Secondary | ICD-10-CM

## 2021-05-16 NOTE — Telephone Encounter (Signed)
1.Medication Requested: ALPRAZolam (XANAX) 0.5 MG tablet    2. Pharmacy (Name, Street, Magnet): [id number] - [place], [place] - [address]  3. On Med List: yes   4. Last Visit with PCP: 10-27-20  5. Next visit date with PCP: n/a    Agent: Please be advised that RX refills may take up to 3 business days. We ask that you follow-up with your pharmacy.

## 2021-06-15 ENCOUNTER — Other Ambulatory Visit: Payer: Self-pay | Admitting: Internal Medicine

## 2021-06-15 DIAGNOSIS — F418 Other specified anxiety disorders: Secondary | ICD-10-CM

## 2021-09-13 ENCOUNTER — Other Ambulatory Visit: Payer: Self-pay | Admitting: Internal Medicine

## 2021-09-13 DIAGNOSIS — F418 Other specified anxiety disorders: Secondary | ICD-10-CM

## 2021-09-25 ENCOUNTER — Other Ambulatory Visit: Payer: Self-pay | Admitting: Internal Medicine

## 2021-09-25 DIAGNOSIS — F5104 Psychophysiologic insomnia: Secondary | ICD-10-CM

## 2021-09-25 DIAGNOSIS — F411 Generalized anxiety disorder: Secondary | ICD-10-CM

## 2021-10-24 ENCOUNTER — Encounter: Payer: Self-pay | Admitting: Internal Medicine

## 2021-10-24 ENCOUNTER — Ambulatory Visit (INDEPENDENT_AMBULATORY_CARE_PROVIDER_SITE_OTHER): Payer: BC Managed Care – PPO | Admitting: Internal Medicine

## 2021-10-24 ENCOUNTER — Other Ambulatory Visit: Payer: Self-pay

## 2021-10-24 ENCOUNTER — Ambulatory Visit (INDEPENDENT_AMBULATORY_CARE_PROVIDER_SITE_OTHER): Payer: BC Managed Care – PPO

## 2021-10-24 VITALS — BP 116/76 | HR 69 | Temp 98.7°F | Ht 65.5 in | Wt 148.0 lb

## 2021-10-24 DIAGNOSIS — R0781 Pleurodynia: Secondary | ICD-10-CM | POA: Insufficient documentation

## 2021-10-24 DIAGNOSIS — R079 Chest pain, unspecified: Secondary | ICD-10-CM | POA: Diagnosis not present

## 2021-10-24 DIAGNOSIS — F418 Other specified anxiety disorders: Secondary | ICD-10-CM | POA: Diagnosis not present

## 2021-10-24 DIAGNOSIS — K589 Irritable bowel syndrome without diarrhea: Secondary | ICD-10-CM

## 2021-10-24 LAB — D-DIMER, QUANTITATIVE: D-Dimer, Quant: <0.19 mcg/mL FEU (ref <0.50)

## 2021-10-24 MED ORDER — SERTRALINE HCL 100 MG PO TABS: 100.0000 mg | ORAL_TABLET | Freq: Every day | ORAL | 0 refills | Status: DC

## 2021-10-24 NOTE — Patient Instructions (Signed)
Chest Wall Pain Chest wall pain is pain in or around the bones and muscles of your chest. Sometimes, an injury causes this pain. Excessive coughing or overuse of arm and chest muscles may also cause chest wall pain. Sometimes, the cause may not be known. This pain may take several weeks or longer to get better. Follow these instructions at home: Managing pain, stiffness, and swelling  If directed, put ice on the painful area: Put ice in a plastic bag. Place a towel between your skin and the bag. Leave the ice on for 20 minutes, 2-3 times per day. Activity Rest as told by your health care provider. Avoid activities that cause pain. These include any activities that use your chest muscles or your abdominal and side muscles to lift heavy items. Ask your health care provider what activities are safe for you. General instructions  Take over-the-counter and prescription medicines only as told by your health care provider. Do not use any products that contain nicotine or tobacco, such as cigarettes, e-cigarettes, and chewing tobacco. These can delay healing after injury. If you need help quitting, ask your health care provider. Keep all follow-up visits as told by your health care provider. This is important. Contact a health care provider if: You have a fever. Your chest pain becomes worse. You have new symptoms. Get help right away if: You have nausea or vomiting. You feel sweaty or light-headed. You have a cough with mucus from your lungs (sputum) or you cough up blood. You develop shortness of breath. These symptoms may represent a serious problem that is an emergency. Do not wait to see if the symptoms will go away. Get medical help right away. Call your local emergency services (911 in the [place] Do not drive yourself to the hospital. Summary Chest wall pain is pain in or around the bones and muscles of your chest. Depending on the cause, it may be treated with ice, rest, medicines, and  avoiding activities that cause pain. Contact a health care provider if you have a fever, worsening chest pain, or new symptoms. Get help right away if you feel light-headed or you develop shortness of breath. These symptoms may be an emergency. This information is not intended to replace advice given to you by your health care provider. Make sure you discuss any questions you have with your health care provider. Document Revised: 02/17/2021 Document Reviewed: 02/17/2021 Elsevier Patient Education  2022 [employer]  

## 2021-10-24 NOTE — Progress Notes (Signed)
Subjective:  Patient ID: Erin Mays, female    DOB: 1981-09-19  Age: 40 y.o. MRN: [id number]  CC: Chest Pain  This visit occurred during the SARS-CoV-2 public health emergency.  Safety protocols were in place, including screening questions prior to the visit, additional usage of staff PPE, and extensive cleaning of exam room while observing appropriate contact time as indicated for disinfecting solutions.    HPI Erin Mays presents for f/up -   About a month ago she decided to taper off of Xanax.  Since then she has had intermittent episodes of chest pain.  The chest pain is usually over the left anterior region but it will also radiate to her breastbone and to the right side.  She describes it as mostly an aching, shooting pain but it is also intermittently pleuritic.  In addition she complains of flatus, intermittent abdominal cramping, and a bubble sensation in her abdomen.  She denies nausea, vomiting, loss of appetite, weight loss, diarrhea, constipation, bright red blood per rectum, or melena.  Outpatient Medications Prior to Visit  Medication Sig Dispense Refill   ALPRAZolam (XANAX) 0.5 MG tablet TAKE ONE TABLET BY MOUTH TWICE A DAY AS NEEDED FOR ANXIETY 180 tablet 0   fluticasone (FLONASE) 50 MCG/ACT nasal spray SPRAY 2 SPRAYS INTO EACH NOSTRIL EVERY DAY 48 mL 2   sertraline (ZOLOFT) 100 MG tablet TAKE ONE TABLET BY MOUTH ONE TIME DAILY 90 tablet 0   Cholecalciferol (VITAMIN D-3) 25 MCG (1000 UT) CAPS Take by mouth.     loratadine (CLARITIN) 10 MG tablet Take 10 mg by mouth as needed.     MOBIC 7.5 MG tablet Take 7.5 mg by mouth daily.     No facility-administered medications prior to visit.    ROS Review of Systems  Constitutional:  Negative for chills, diaphoresis, fatigue and fever.  HENT: Negative.  Negative for sore throat.   Eyes: Negative.   Respiratory:  Negative for cough, chest tightness, shortness of breath and wheezing.   Cardiovascular:  Positive  for chest pain. Negative for palpitations and leg swelling.  Gastrointestinal:  Positive for abdominal pain. Negative for blood in stool, constipation, diarrhea, nausea and vomiting.  Endocrine: Negative.   Genitourinary: Negative.  Negative for difficulty urinating.  Musculoskeletal: Negative.  Negative for arthralgias and myalgias.  Skin: Negative.  Negative for rash.  Neurological:  Negative for dizziness, weakness and light-headedness.  Hematological:  Negative for adenopathy. Does not bruise/bleed easily.  Psychiatric/Behavioral: Negative.     Objective:  BP 116/76 (BP Location: Left Arm, Patient Position: Sitting, Cuff Size: Large)   Pulse 69   Temp 98.7 F (37.1 C) (Oral)   Ht 5' 5.5" (1.664 m)   Wt 148 lb (67.1 kg)   LMP 10/10/2021   SpO2 99%   BMI 24.25 kg/m   BP Readings from Last 3 Encounters:  10/24/21 116/76  01/19/21 112/70  10/27/20 122/74    Wt Readings from Last 3 Encounters:  10/24/21 148 lb (67.1 kg)  01/19/21 148 lb 9.6 oz (67.4 kg)  10/27/20 148 lb (67.1 kg)    Physical Exam Vitals reviewed.  Constitutional:      Appearance: Normal appearance.  HENT:     Mouth/Throat:     Mouth: Mucous membranes are moist.  Eyes:     General: No scleral icterus.    Conjunctiva/sclera: Conjunctivae normal.  Cardiovascular:     Rate and Rhythm: Normal rate and regular rhythm.     Heart sounds: Normal heart sounds,  S1 normal and S2 normal. No murmur heard.   No friction rub. No gallop.     Comments: EKG- NSR, 62 bpm Normal EKG Pulmonary:     Effort: Pulmonary effort is normal.     Breath sounds: No stridor. No wheezing, rhonchi or rales.  Chest:     Chest wall: Tenderness present. No mass, deformity, swelling or edema.  Breasts:    Breasts are symmetrical.    Musculoskeletal:     Cervical back: Neck supple.     Right lower leg: No edema.     Left lower leg: No edema.  Neurological:     Mental Status: She is alert.    Lab Results  Component Value  Date   WBC 9.1 10/24/2021   HGB 14.2 10/24/2021   HCT 42.5 10/24/2021   PLT 310.0 10/24/2021   GLUCOSE 83 04/14/2015   CHOL 216 (H) 11/29/2017   TRIG 85.0 11/29/2017   HDL 82.60 11/29/2017   LDLDIRECT 147.9 10/29/2013   LDLCALC 116 (H) 11/29/2017   ALT 8 07/06/2016   AST 14 07/06/2016   NA 142 07/06/2016   K 4.6 07/06/2016   CL 103 04/14/2015   CREATININE 0.7 07/06/2016   BUN 14 07/06/2016   CO2 27 04/14/2015   TSH 1.70 11/29/2017    DG Ankle Complete Left  Result Date: 04/11/2019 CLINICAL DATA:  Twisting injury. EXAM: LEFT ANKLE COMPLETE - 3+ VIEW COMPARISON:  None. FINDINGS: There is lateral malleolar soft tissue swelling. There is a fractures through the distal fibular metaphysis without significant displacement. The ankle mortise is intact. A crescent shaped 3.6 mm region of high attenuation is seen along the bottom of the foot on the lateral view at the level of the metatarsal bases. IMPRESSION: 1. Distal fibular fracture with associated soft tissue swelling. 2. The 4 mm crescent-shaped focus of high attenuation along the bottom of the foot on the lateral view at the level of the metatarsal bases could represent a soft tissue calcification or and age indeterminate foreign body. Recommend clinical correlation. Electronically Signed   By: Dorise Bullion III M.D   On: 04/11/2019 11:53   DG Chest 2 View  Result Date: 10/25/2021 CLINICAL DATA:  Left-sided chest pain for 1 month EXAM: CHEST - 2 VIEW COMPARISON:  02/05/2019 FINDINGS: The heart size and mediastinal contours are within normal limits. Both lungs are clear. The visualized skeletal structures are unremarkable. IMPRESSION: No active cardiopulmonary disease. Electronically Signed   By: Kathreen Devoid M.D.   On: 10/25/2021 15:44     Assessment & Plan:   Erin Mays was seen today for chest pain.  Diagnoses and all orders for this visit:  Pleuritic chest pain- Based on her symptoms, exam, normal chest x-ray, and normal labs I  think she has a combination of musculoskeletal chest pain and chest pain related to anxiety.  I have offered her reassurance. -     CBC with Differential/Platelet; Future -     DG Chest 2 View; Future -     D-dimer, quantitative; Future -     EKG 12-Lead -     D-dimer, quantitative -     CBC with Differential/Platelet  Depression with anxiety -     sertraline (ZOLOFT) 100 MG tablet; Take 1 tablet (100 mg total) by mouth daily.  Irritable bowel syndrome, unspecified type -     Probiotic Product (ALIGN EXTRA STRENGTH) CAPS; Take 1 capsule by mouth daily.  I have discontinued Yaresly Nisley's loratadine, Vitamin D-3, and  Mobic. I have also changed her sertraline. 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 Lastly, I am having her maintain her fluticasone and ALPRAZolam.  Meds ordered this encounter  Medications   sertraline (ZOLOFT) 100 MG tablet    Sig: Take 1 tablet (100 mg total) by mouth daily.    Dispense:  90 tablet    Refill:  0   Probiotic Product (ALIGN EXTRA STRENGTH) CAPS    Sig: Take 1 capsule by mouth daily.    Dispense:  90 capsule    Refill:  1     Follow-up: Return in about 4 weeks (around 11/21/2021).  Scarlette Calico, MD

## 2021-10-25 DIAGNOSIS — K589 Irritable bowel syndrome without diarrhea: Secondary | ICD-10-CM | POA: Insufficient documentation

## 2021-10-25 LAB — CBC WITH DIFFERENTIAL/PLATELET
Basophils Absolute: 0.1 10*3/uL (ref 0.0–0.1)
Basophils Relative: 0.7 % (ref 0.0–3.0)
Eosinophils Absolute: 0 10*3/uL (ref 0.0–0.7)
Eosinophils Relative: 0.4 % (ref 0.0–5.0)
HCT: 42.5 % (ref 36.0–46.0)
Hemoglobin: 14.2 g/dL (ref 12.0–15.0)
Lymphocytes Relative: 21.2 % (ref 12.0–46.0)
Lymphs Abs: 1.9 10*3/uL (ref 0.7–4.0)
MCHC: 33.3 g/dL (ref 30.0–36.0)
MCV: 90 fl (ref 78.0–100.0)
Monocytes Absolute: 0.8 10*3/uL (ref 0.1–1.0)
Monocytes Relative: 9.3 % (ref 3.0–12.0)
Neutro Abs: 6.3 10*3/uL (ref 1.4–7.7)
Neutrophils Relative %: 68.4 % (ref 43.0–77.0)
Platelets: 310 10*3/uL (ref 150.0–400.0)
RBC: 4.72 Mil/uL (ref 3.87–5.11)
RDW: 12.7 % (ref 11.5–15.5)
WBC: 9.1 10*3/uL (ref 4.0–10.5)

## 2021-10-25 MED ORDER — ALIGN EXTRA STRENGTH PO CAPS: 1.0000 | ORAL_CAPSULE | Freq: Every day | ORAL | 1 refills | Status: DC

## 2022-01-23 ENCOUNTER — Ambulatory Visit: Payer: BC Managed Care – PPO | Admitting: Nurse Practitioner

## 2022-03-13 ENCOUNTER — Encounter: Payer: Self-pay | Admitting: Nurse Practitioner

## 2022-03-13 ENCOUNTER — Ambulatory Visit (INDEPENDENT_AMBULATORY_CARE_PROVIDER_SITE_OTHER): Payer: BC Managed Care – PPO | Admitting: Nurse Practitioner

## 2022-03-13 ENCOUNTER — Other Ambulatory Visit: Payer: Self-pay

## 2022-03-13 VITALS — BP 118/76 | Ht 65.0 in | Wt 149.0 lb

## 2022-03-13 DIAGNOSIS — R5383 Other fatigue: Secondary | ICD-10-CM

## 2022-03-13 DIAGNOSIS — Z01419 Encounter for gynecological examination (general) (routine) without abnormal findings: Secondary | ICD-10-CM | POA: Diagnosis not present

## 2022-03-13 DIAGNOSIS — R61 Generalized hyperhidrosis: Secondary | ICD-10-CM

## 2022-03-13 NOTE — Progress Notes (Signed)
? ?Erin Mays 09/10/1981 [id number] ? ? ?History:  41 y.o. G0 presents for annual exam. Monthly cycles. She feels they have gotten slightly heavier over the last year. She has been having night sweats, fatigue, mood changes, and worsening insomnia for 3-6 months. She has always had insomnia with trouble falling asleep and staying asleep. She has tried melatonin, Ambien, xanax and others in the past with little improvement. Normal pap history. Anxiety and depression managed by PCP. She has been taking half of her Zoloft and does feel increased stress and anxiety due to work.  ? ?Gynecologic History ?Patient's last menstrual period was 02/19/2022. ?Period Cycle (Days): 28 ?Period Duration (Days): 7 ?Period Pattern: Regular ?Menstrual Flow: Light ?Dysmenorrhea: (!) Mild ?Dysmenorrhea Symptoms: Cramping ?Contraception/Family planning: rhythm method ?Sexually active: No ? ?Health Maintenance ?Last Pap: 01/19/2020. Results were: Normal, 5-year repeat ?Last mammogram: 03/2021. Results were: Normal per pt ?Last colonoscopy: Not indicated ?Last Dexa: Not indicated ? ?Past medical history, past surgical history, family history and social history were all reviewed and documented in the EPIC chart. Boyfriend of 19 years. Works in Audiological scientist at [employer].  ? ?ROS:  A ROS was performed and pertinent positives and negatives are included. ? ?Exam: ? ?Vitals:  ? 03/13/22 1423  ?BP: 118/76  ?Weight: 149 lb (67.6 kg)  ?Height: 5\' 5"  (1.651 m)  ? ? ?Body mass index is 24.79 kg/m?. ? ?General appearance:  Normal ?Thyroid:  Symmetrical, normal in size, without palpable masses or nodularity. ?Respiratory ? Auscultation:  Clear without wheezing or rhonchi ?Cardiovascular ? Auscultation:  Regular rate, without rubs, murmurs or gallops ? Edema/varicosities:  Not grossly evident ?Abdominal ? Soft,nontender, without masses, guarding or rebound. ? Liver/spleen:  No organomegaly noted ? Hernia:  None appreciated ? Skin ? Inspection:   Grossly normal ?  ?Breasts: Examined lying and sitting.  ? Right: Without masses, retractions, discharge or axillary adenopathy. ? ? Left: Without masses, retractions, discharge or axillary adenopathy. ?Genitourinary  ? Inguinal/mons:  Normal without inguinal adenopathy ? External genitalia:  Normal appearing vulva with no masses, tenderness, or lesions ? BUS/Urethra/Skene's glands:  Normal ? Vagina:  Normal appearing with normal color and discharge, no lesions ? Cervix:  Normal appearing without discharge or lesions ? Uterus:  Normal in size, shape and contour.  Midline and mobile, nontender ? Adnexa/parametria:   ?  Rt: Normal in size, without masses or tenderness. ?  Lt: Normal in size, without masses or tenderness. ? Anus and perineum: Normal ? Digital rectal exam: Not indicated ? ?Patient informed chaperone available to be present for breast and pelvic exam. Patient has requested no chaperone to be present. Patient has been advised what will be completed during breast and pelvic exam.  ? ?Assessment/Plan:  41 y.o. G0 for annual exam.  ? ?Well female exam with routine gynecological exam - Plan: CBC with Differential/Platelet, Comprehensive metabolic panel, VITAMIN D 25 Hydroxy (Vit-D Deficiency, Fractures). Education provided on SBEs, importance of preventative screenings, current guidelines, high calcium diet, regular exercise, and multivitamin daily. Lipid panel done through work.  ? ?Fatigue, unspecified type - Plan: TSH, CBC with Differential/Platelet, Comprehensive metabolic panel, VITAMIN D 25 Hydroxy (Vit-D Deficiency, Fractures), Vitamin B12. She has increased stress at work. Recommend increasing Zoloft back to prescribed dose of 100 mg daily. Recommend Magnesium supplement at bedtime with Melatonin.  ? ?Night sweats - Plan: TSH. Not likely perimenopausal due to age and regularity of menses.  ? ?Screening for cervical cancer - Normal Pap history.  Will repeat  at 5-year interval per  guidelines. ? ?Screening for breast cancer - Normal mammogram history. Continue annual screenings. She gets these with mobile unit at work. Normal breast exam today. ? ?Follow up in 1 year for annual.  ? ? ? ?Olivia Mackie [facility], 2:40 PM 03/13/2022 ? ?

## 2022-03-14 ENCOUNTER — Other Ambulatory Visit: Payer: Self-pay | Admitting: Nurse Practitioner

## 2022-03-14 DIAGNOSIS — E559 Vitamin D deficiency, unspecified: Secondary | ICD-10-CM

## 2022-03-14 LAB — VITAMIN B12: Vitamin B-12: 552 pg/mL (ref 200–1100)

## 2022-03-14 LAB — TSH: TSH: 1.86 mIU/L

## 2022-03-14 LAB — CBC WITH DIFFERENTIAL/PLATELET
Absolute Monocytes: 896 cells/uL (ref 200–950)
Basophils Absolute: 17 cells/uL (ref 0–200)
Basophils Relative: 0.2 %
Eosinophils Absolute: 78 cells/uL (ref 15–500)
Eosinophils Relative: 0.9 %
HCT: 44.1 % (ref 35.0–45.0)
Hemoglobin: 14.9 g/dL (ref 11.7–15.5)
Lymphs Abs: 2349 cells/uL (ref 850–3900)
MCH: 30.2 pg (ref 27.0–33.0)
MCHC: 33.8 g/dL (ref 32.0–36.0)
MCV: 89.5 fL (ref 80.0–100.0)
MPV: 10.3 fL (ref 7.5–12.5)
Monocytes Relative: 10.3 %
Neutro Abs: 5359 cells/uL (ref 1500–7800)
Neutrophils Relative %: 61.6 %
Platelets: 376 10*3/uL (ref 140–400)
RBC: 4.93 10*6/uL (ref 3.80–5.10)
RDW: 12.4 % (ref 11.0–15.0)
Total Lymphocyte: 27 %
WBC: 8.7 10*3/uL (ref 3.8–10.8)

## 2022-03-14 LAB — COMPREHENSIVE METABOLIC PANEL
AG Ratio: 1.8 (calc) (ref 1.0–2.5)
ALT: 10 U/L (ref 6–29)
AST: 14 U/L (ref 10–30)
Albumin: 4.6 g/dL (ref 3.6–5.1)
Alkaline phosphatase (APISO): 63 U/L (ref 31–125)
BUN: 24 mg/dL (ref 7–25)
CO2: 25 mmol/L (ref 20–32)
Calcium: 9.6 mg/dL (ref 8.6–10.2)
Chloride: 104 mmol/L (ref 98–110)
Creat: 0.79 mg/dL (ref 0.50–0.99)
Globulin: 2.5 g/dL (calc) (ref 1.9–3.7)
Glucose, Bld: 92 mg/dL (ref 65–99)
Potassium: 4.6 mmol/L (ref 3.5–5.3)
Sodium: 141 mmol/L (ref 135–146)
Total Bilirubin: 0.5 mg/dL (ref 0.2–1.2)
Total Protein: 7.1 g/dL (ref 6.1–8.1)

## 2022-03-14 LAB — VITAMIN D 25 HYDROXY (VIT D DEFICIENCY, FRACTURES): Vit D, 25-Hydroxy: 24 ng/mL — ABNORMAL LOW (ref 30–100)

## 2022-03-14 MED ORDER — VITAMIN D (ERGOCALCIFEROL) 1.25 MG (50000 UNIT) PO CAPS: 50000.0000 [IU] | ORAL_CAPSULE | ORAL | 0 refills | Status: AC

## 2022-03-28 DIAGNOSIS — Z1231 Encounter for screening mammogram for malignant neoplasm of breast: Secondary | ICD-10-CM | POA: Diagnosis not present

## 2022-03-28 LAB — HM MAMMOGRAPHY

## 2022-03-28 LAB — HM PAP SMEAR

## 2022-07-02 ENCOUNTER — Ambulatory Visit: Payer: BC Managed Care – PPO | Admitting: Podiatry

## 2022-07-03 ENCOUNTER — Ambulatory Visit (INDEPENDENT_AMBULATORY_CARE_PROVIDER_SITE_OTHER): Payer: BC Managed Care – PPO

## 2022-07-03 ENCOUNTER — Ambulatory Visit: Payer: BC Managed Care – PPO | Admitting: Podiatry

## 2022-07-03 DIAGNOSIS — M21611 Bunion of right foot: Secondary | ICD-10-CM

## 2022-07-03 DIAGNOSIS — M7741 Metatarsalgia, right foot: Secondary | ICD-10-CM | POA: Diagnosis not present

## 2022-07-03 DIAGNOSIS — M7742 Metatarsalgia, left foot: Secondary | ICD-10-CM

## 2022-07-03 DIAGNOSIS — Q6671 Congenital pes cavus, right foot: Secondary | ICD-10-CM | POA: Diagnosis not present

## 2022-07-03 DIAGNOSIS — Q6672 Congenital pes cavus, left foot: Secondary | ICD-10-CM | POA: Diagnosis not present

## 2022-07-03 NOTE — Progress Notes (Signed)
  Subjective:  Patient ID: Erin Mays, female    DOB: [birth year],  MRN: [id number]  Chief Complaint  Patient presents with   Foot Pain    Right foot pain     41 y.o. female presents with the above complaint. History confirmed with patient.  Does not recall any injury that started this.  Is across the ball of the foot.  She noticed this first after having tissue massage during pedicure  Objective:  Physical Exam: warm, good capillary refill, no trophic changes or ulcerative lesions, normal DP and PT pulses, normal sensory exam, and mild tenderness in first interspace.  Nonspecific.  Does not localize to sesamoids, no evidence of neuroma or bursitis currently.  She does have a relative pes cavus foot type bilateral, thinning of the metatarsal pad noted   Radiographs: Multiple views x-ray of the right foot: no fracture, dislocation, swelling or degenerative changes noted and pes cavus foot type Assessment:   1. Metatarsalgia of both feet   2. Bunion of great toe of right foot      Plan:  Patient was evaluated and treated and all questions answered.  Discussed metatarsalgia and how this relates to her pes cavus foot type and increased forefoot pressure.  Detailed shoe gears that are supportive she feels better and Birkenstocks and supportive running shoes then she does a flat or heel.  Discussed avoidance of pressure on these areas and also custom-molded orthoses which may support her arch better than the premade orthoses she currently has.  She will check with her insurance on her coverage for this and let me know if she is ready for casting.  I expect she will benefit greatly from this supporting the medial and transverse longitudinal arches to alleviate pressure on the forefoot.  There is no clear evidence today of sesamoiditis intermetatarsal bursitis or neuroma but these are less likely etiologiesand are not completely ruled out.  She will return to see me as needed if the pain  worsens.  Advanced imaging such as MRI or ultrasound may offer further information.  No follow-ups on file.

## 2022-08-28 ENCOUNTER — Other Ambulatory Visit: Payer: Self-pay | Admitting: Internal Medicine

## 2022-08-28 DIAGNOSIS — F418 Other specified anxiety disorders: Secondary | ICD-10-CM

## 2022-11-09 DIAGNOSIS — J209 Acute bronchitis, unspecified: Secondary | ICD-10-CM | POA: Diagnosis not present

## 2022-11-16 ENCOUNTER — Ambulatory Visit
Admission: EM | Admit: 2022-11-16 | Discharge: 2022-11-16 | Disposition: A | Discharge status: Home / Self Care | Payer: BC Managed Care – PPO

## 2022-11-16 DIAGNOSIS — J358 Other chronic diseases of tonsils and adenoids: Secondary | ICD-10-CM

## 2022-11-16 DIAGNOSIS — J302 Other seasonal allergic rhinitis: Secondary | ICD-10-CM

## 2022-11-16 DIAGNOSIS — J4521 Mild intermittent asthma with (acute) exacerbation: Secondary | ICD-10-CM | POA: Diagnosis not present

## 2022-11-16 DIAGNOSIS — R509 Fever, unspecified: Secondary | ICD-10-CM

## 2022-11-16 DIAGNOSIS — B9689 Other specified bacterial agents as the cause of diseases classified elsewhere: Secondary | ICD-10-CM

## 2022-11-16 DIAGNOSIS — J039 Acute tonsillitis, unspecified: Secondary | ICD-10-CM | POA: Diagnosis not present

## 2022-11-16 MED ORDER — IBUPROFEN 400 MG PO TABS: 400.0000 mg | ORAL_TABLET | Freq: Three times a day (TID) | ORAL | 0 refills | Status: DC | PRN

## 2022-11-16 MED ORDER — FLUCONAZOLE 150 MG PO TABS: ORAL_TABLET | ORAL | 0 refills | Status: DC

## 2022-11-16 MED ORDER — AMOXICILLIN-POT CLAVULANATE 500-125 MG PO TABS: 1.0000 | ORAL_TABLET | Freq: Three times a day (TID) | ORAL | 0 refills | Status: AC

## 2022-11-16 MED ORDER — LORATADINE 10 MG PO TABS: 10.0000 mg | ORAL_TABLET | Freq: Every day | ORAL | 1 refills | Status: AC

## 2022-11-16 MED ORDER — FLUTICASONE PROPIONATE 50 MCG/ACT NA SUSP: 1.0000 | Freq: Every day | NASAL | 2 refills | Status: AC

## 2022-11-16 MED ORDER — CEFDINIR 300 MG PO CAPS: 300.0000 mg | ORAL_CAPSULE | Freq: Two times a day (BID) | ORAL | 0 refills | Status: DC

## 2022-11-16 MED ORDER — DEXAMETHASONE SODIUM PHOSPHATE 10 MG/ML IJ SOLN
10.0000 mg | Freq: Once | INTRAMUSCULAR | Status: AC
  Administered 2022-11-16: 10 mg via INTRAMUSCULAR

## 2022-11-16 MED ORDER — LIDOCAINE VISCOUS HCL 2 % MT SOLN: 15.0000 mL | OROMUCOSAL | 0 refills | Status: DC | PRN

## 2022-11-16 NOTE — Discharge Instructions (Addendum)
Please read below to learn more about the medications, dosages and frequencies that I recommend to help alleviate your symptoms and to get you feeling better soon:   Decadron IM (dexamethasone):  To quickly address your significant respiratory inflammation, you were provided with an injection of Decadron in the office today.  You should continue to feel the full benefit of the steroid for the next 12-24 hours.    Augmentin (amoxicillin - clavulanic acid):  take 1 tablet 3 times daily for 10 days, you can take it with or without food.  This antibiotic can cause upset stomach, this will resolve once antibiotics are complete.  You are welcome to use a probiotic, eat yogurt, take Imodium while taking this medication.  Please avoid other systemic medications such as Maalox, Pepto-Bismol or milk of magnesia as they can interfere with your body's ability to absorb the antibiotics.       Diflucan (fluconazole): As you may or may not be aware, taking antibiotics can often cause patients to develop a vaginal yeast infection.  For this reason, I have provided you with a prescription for Diflucan, and antifungal medication used to treat vaginal yeast infections.  Please take the first Diflucan tablet on day 3 or 4 of your antibiotic therapy, and take the second Diflucan tablet 3 days later.  You do not need to pick up this prescription or take this medication unless you develop symptoms of vaginal yeast infection including thick, white vaginal discharge and/or vaginal itching.  This prescription has been provided as a Research officer, political party and for your convenience.   Claritin (loratadine): This is an excellent second-generation antihistamine that helps to reduce respiratory inflammatory response to environmental allergens.  This medication is not known to cause daytime sleepiness so it can be taken in the daytime.  If you find that it does make you sleepy, please feel free to take it at bedtime.   Flonase (fluticasone): This is a  steroid nasal spray that you use once daily, 1 spray in each nare.  This medication does not work well if you decide to use it only used as you feel you need to, it works best used on a daily basis.  After 3 to 5 days of use, you will notice significant reduction of the inflammation and mucus production that is currently being caused by exposure to allergens, whether seasonal or environmental.  The most common side effect of this medication is nosebleeds.  If you experience a nosebleed, please discontinue use for 1 week, then feel free to resume.  I have provided you with a prescription.    Advil, Motrin (ibuprofen): This is a good anti-inflammatory medication which not only addresses aches, pains but also significantly reduces soft tissue inflammation of the upper airways that causes sinus and nasal congestion as well as inflammation of the lower airways which makes you feel like your breathing is constricted or your cough feel tight.  I recommend that you take 400 mg every 8 hours as needed.     Please continue using your albuterol 2 puffs 3-4 times daily to keep your lungs calm.   If you find that your health insurance will not pay for allergy medications, please consider downloading the GoodRx app and using to get a better price than the "off the shelf" price.        If you find that you have not had improvement of your symptoms in the next 5 to 7 days, please follow-up with your primary care provider or  return here to urgent care for repeat evaluation and further recommendations.   Thank you for visiting urgent care today.  We appreciate the opportunity to participate in your care.

## 2022-11-16 NOTE — ED Notes (Signed)
COVID, Flu test was ordered by mistake by provider.

## 2022-11-16 NOTE — ED Triage Notes (Signed)
Patient presents to UC for productive cough x 11/23. Facial pressure x 1 week. SOB x 2 days. States she had a teledoc appt and was prescribed albuterol with no improvement.   Denies fever.

## 2022-11-16 NOTE — ED Provider Notes (Signed)
[facility]    CSN: [id number] Arrival date & time: 11/16/22  9371    HISTORY   Chief Complaint  Patient presents with   Cough    Bronchitis since 11/08/2022 and possible sinus infection - Entered by patient   HPI Erin Mays is a pleasant, 41 y.o. female who presents to urgent care today. Patient complains of a productive cough since November 08, 2022.  Patient states she is also had facial pressure for a week and shortness of breath for 2 days.  Patient states she had a telehealth visit a few days ago and was advised to use albuterol but states this has not helped.  Patient states she has not had any fever, body aches, chills, nausea, vomiting, diarrhea.  Patient denies known sick contacts, loss of taste or smell, otalgia, cough productive of dark-colored or foul tasting sputum, purulent sinus drainage.  Patient states that her facial pressure is worse on the left than it is on the right and also feels the pain is radiating to her right upper teeth.  The history is provided by the patient.   Past Medical History:  Diagnosis Date   Allergy    Anxiety    Depression    Headache    Hyperlipidemia    Patient Active Problem List   Diagnosis Date Noted   Irritable bowel syndrome 10/25/2021   Pleuritic chest pain 10/24/2021   Psychophysiological insomnia 10/27/2020   Cervical cancer screening 09/08/2019   Seborrheic dermatitis 09/25/2012   Contraception management 09/25/2012   Routine general medical examination at a health care facility 09/20/2011   Hyperlipidemia with target LDL less than 160 08/17/2009   GAD (generalized anxiety disorder) 08/16/2008   Allergic rhinitis 05/14/2007   Depression with anxiety 05/07/2007   Past Surgical History:  Procedure Laterality Date   APPENDECTOMY     OB History     Gravida  0   Para  0   Term  0   Preterm  0   AB  0   Living  0      SAB  0   IAB  0   Ectopic  0   Multiple  0   Live Births  0           Home Medications    Prior to Admission medications   Medication Sig Start Date End Date Taking? Authorizing Provider  albuterol (VENTOLIN HFA) 108 (90 Base) MCG/ACT inhaler Inhale into the lungs. 11/09/22  Yes [provider]  amoxicillin-clavulanate (AUGMENTIN) 500-125 MG tablet Take 1 tablet by mouth 3 (three) times daily for 10 days. 11/16/22 11/26/22 Yes Theadora Rama Scales, PA-C  fluconazole (DIFLUCAN) 150 MG tablet Take 1 tablet on day 4 of antibiotics.  Take second tablet 3 days later. 11/16/22  Yes Theadora Rama Scales, PA-C  fluticasone (FLONASE) 50 MCG/ACT nasal spray Place 1 spray into both nostrils daily. Begin by using 2 sprays in each nare daily for 3 to 5 days, then decrease to 1 spray in each nare daily. 11/16/22  Yes Theadora Rama Scales, PA-C  ibuprofen (ADVIL) 400 MG tablet Take 1 tablet (400 mg total) by mouth every 8 (eight) hours as needed for up to 30 doses. 11/16/22  Yes Theadora Rama Scales, PA-C  loratadine (CLARITIN) 10 MG tablet Take 1 tablet (10 mg total) by mouth daily. 11/16/22 05/15/23 Yes Theadora Rama Scales, PA-C  ALPRAZolam Prudy Feeler) 0.5 MG tablet TAKE ONE TABLET BY MOUTH TWICE A DAY AS NEEDED FOR ANXIETY 05/17/21  Etta GrandchildJones, Thomas L, MD  Probiotic Product (ALIGN EXTRA STRENGTH) CAPS Take 1 capsule by mouth daily. 10/25/21   Etta GrandchildJones, Thomas L, MD  sertraline (ZOLOFT) 100 MG tablet TAKE ONE TABLET BY MOUTH ONE TIME DAILY 08/28/22   Etta GrandchildJones, Thomas L, MD    Family History Family History  Problem Relation Age of Onset   Hypertension Mother    Diabetes Maternal Aunt        breast cancer- great aunt   Diabetes Maternal Grandfather    Depression Other    Hyperlipidemia Other    Hypertension Other    Cancer Paternal Grandmother        GIST   Depression Sister    Depression Brother    Social History Social History   Tobacco Use   Smoking status: Never   Smokeless tobacco: Never  Vaping Use   Vaping Use: Never used  Substance Use Topics    Alcohol use: Not Currently    Alcohol/week: 15.0 standard drinks of alcohol    Types: 15 Standard drinks or equivalent per week   Drug use: No   Allergies   Patient has no known allergies.  Review of Systems Review of Systems Pertinent findings revealed after performing a 14 point review of systems has been noted in the history of present illness.  Physical Exam Triage Vital Signs ED Triage Vitals  Enc Vitals Group     BP 10/13/21 0827 (!) 147/82     Pulse Rate 10/13/21 0827 72     Resp 10/13/21 0827 18     Temp 10/13/21 0827 98.3 F (36.8 C)     Temp Source 10/13/21 0827 Oral     SpO2 10/13/21 0827 98 %     Weight --      Height --      Head Circumference --      Peak Flow --      Pain Score 10/13/21 0826 5     Pain Loc --      Pain Edu? --      Excl. in GC? --   No data found.  Updated Vital Signs BP (!) 142/84 (BP Location: Right Arm)   Pulse 90   Temp 97.6 F (36.4 C) (Oral)   Resp 16   LMP 11/02/2022 (Approximate)   SpO2 96%   Physical Exam Vitals and nursing note reviewed.  Constitutional:      General: She is not in acute distress.    Appearance: Normal appearance. She is not ill-appearing.  HENT:     Head: Normocephalic and atraumatic.     Salivary Glands: Right salivary gland is not diffusely enlarged or tender. Left salivary gland is not diffusely enlarged or tender.     Right Ear: Hearing, ear canal and external ear normal. No drainage. No middle ear effusion. There is no impacted cerumen. Tympanic membrane is bulging. Tympanic membrane is not injected or erythematous.     Left Ear: Hearing, ear canal and external ear normal. No drainage. A middle ear effusion is present. There is no impacted cerumen. Tympanic membrane is bulging. Tympanic membrane is not injected or erythematous.     Ears:     Comments: Bilateral EACs normal, both TMs bulging with clear fluid    Nose: Rhinorrhea present. No nasal deformity, septal deviation, signs of injury, nasal  tenderness, mucosal edema or congestion. Rhinorrhea is clear.     Right Nostril: Occlusion present. No foreign body, epistaxis or septal hematoma.     Left Nostril:  Occlusion present. No foreign body, epistaxis or septal hematoma.     Right Turbinates: Enlarged, swollen and pale.     Left Turbinates: Enlarged, swollen and pale.     Right Sinus: No maxillary sinus tenderness or frontal sinus tenderness.     Left Sinus: No maxillary sinus tenderness or frontal sinus tenderness.     Mouth/Throat:     Lips: Pink. No lesions.     Mouth: Mucous membranes are moist. No oral lesions.     Tongue: No lesions. Tongue does not deviate from midline.     Palate: No mass and lesions.     Pharynx: Oropharynx is clear. Uvula midline. No pharyngeal swelling, oropharyngeal exudate, posterior oropharyngeal erythema or uvula swelling.     Tonsils: No tonsillar exudate. 0 on the right. 0 on the left.     Comments: Purulent postnasal drip Eyes:     General: Lids are normal.        Right eye: No discharge.        Left eye: No discharge.     Extraocular Movements: Extraocular movements intact.     Conjunctiva/sclera: Conjunctivae normal.     Right eye: Right conjunctiva is not injected.     Left eye: Left conjunctiva is not injected.  Neck:     Trachea: Trachea and phonation normal.  Cardiovascular:     Rate and Rhythm: Normal rate and regular rhythm.     Pulses: Normal pulses.     Heart sounds: Normal heart sounds. No murmur heard.    No friction rub. No gallop.  Pulmonary:     Effort: Pulmonary effort is normal. No accessory muscle usage, prolonged expiration or respiratory distress.     Breath sounds: Normal breath sounds. No stridor, decreased air movement or transmitted upper airway sounds. No decreased breath sounds, wheezing, rhonchi or rales.  Chest:     Chest wall: No tenderness.  Musculoskeletal:        General: Normal range of motion.     Cervical back: Normal range of motion and neck supple.  Normal range of motion.  Lymphadenopathy:     Cervical: No cervical adenopathy.  Skin:    General: Skin is warm and dry.     Findings: No erythema or rash.  Neurological:     General: No focal deficit present.     Mental Status: She is alert and oriented to person, place, and time.  Psychiatric:        Mood and Affect: Mood normal.        Behavior: Behavior normal.     Visual Acuity Right Eye Distance:   Left Eye Distance:   Bilateral Distance:    Right Eye Near:   Left Eye Near:    Bilateral Near:     UC Couse / Diagnostics / Procedures:     Radiology No results found.  Procedures Procedures (including critical care time) EKG  Pending results:  Labs Reviewed  RESP PANEL BY RT-PCR (FLU A&B, COVID) [id number]    Medications Ordered in UC: Medications  dexamethasone (DECADRON) injection 10 mg (10 mg Intramuscular Given 11/16/22 1156)    UC Diagnoses / Final Clinical Impressions(s)   I have reviewed the triage vital signs and the nursing notes.  Pertinent labs & imaging results that were available during my care of the patient were reviewed by me and considered in my medical decision making (see chart for details).    Final diagnoses:  Febrile illness  Seasonal allergic rhinitis, unspecified  trigger  Mild intermittent asthma with acute exacerbation in adult  Acute bacterial sinusitis   Based on physical exam findings and due to duration of illness, will treat patient empirically for presumed bacterial sinusitis that is beginning to spread to her left inner ear with a 10-day course of Augmentin.  Patient provided with Diflucan for the inevitable vaginal yeast infection caused by prolonged exposure to antibiotics.  Patient advised to continue Claritin which she states she has been taking and to add Flonase for relief of nasal congestion and to promote sinus drainage.  Conservative care also recommended.  Return precautions advised.  Please see discharge directions below  for further details of plan of care. ED Prescriptions     Medication Sig Dispense Auth. Provider   amoxicillin-clavulanate (AUGMENTIN) 500-125 MG tablet Take 1 tablet by mouth 3 (three) times daily for 10 days. 30 tablet Theadora Rama Scales, PA-C   loratadine (CLARITIN) 10 MG tablet Take 1 tablet (10 mg total) by mouth daily. 90 tablet Theadora Rama Scales, PA-C   ibuprofen (ADVIL) 400 MG tablet Take 1 tablet (400 mg total) by mouth every 8 (eight) hours as needed for up to 30 doses. 30 tablet Theadora Rama Scales, PA-C   fluticasone (FLONASE) 50 MCG/ACT nasal spray Place 1 spray into both nostrils daily. Begin by using 2 sprays in each nare daily for 3 to 5 days, then decrease to 1 spray in each nare daily. 15.8 mL Theadora Rama Scales, PA-C   fluconazole (DIFLUCAN) 150 MG tablet Take 1 tablet on day 4 of antibiotics.  Take second tablet 3 days later. 2 tablet Theadora Rama Scales, PA-C      PDMP not reviewed this encounter.  Disposition Upon Discharge:  Condition: stable for discharge home Home: take medications as prescribed; routine discharge instructions as discussed; follow up as advised.  Patient presented with an acute illness with associated systemic symptoms and significant discomfort requiring urgent management. In my opinion, this is a condition that a prudent lay person (someone who possesses an average knowledge of health and medicine) may potentially expect to result in complications if not addressed urgently such as respiratory distress, impairment of bodily function or dysfunction of bodily organs.   Routine symptom specific, illness specific and/or disease specific instructions were discussed with the patient and/or caregiver at length.   As such, the patient has been evaluated and assessed, work-up was performed and treatment was provided in alignment with urgent care protocols and evidence based medicine.  Patient/parent/caregiver has been advised that the patient  may require follow up for further testing and treatment if the symptoms continue in spite of treatment, as clinically indicated and appropriate.  If the patient was tested for COVID-19, Influenza and/or RSV, then the patient/parent/guardian was advised to isolate at home pending the results of his/her diagnostic coronavirus test and potentially longer if they're positive. I have also advised pt that if his/her COVID-19 test returns positive, it's recommended to self-isolate for at least 10 days after symptoms first appeared AND until fever-free for 24 hours without fever reducer AND other symptoms have improved or resolved. Discussed self-isolation recommendations as well as instructions for household member/close contacts as per the [facility] and [place] DHHS, and also gave patient the COVID packet with this information.  Patient/parent/caregiver has been advised to return to the [facility] or PCP in 3-5 days if no better; to PCP or the Emergency Department if new signs and symptoms develop, or if the current signs or symptoms continue to change or  worsen for further workup, evaluation and treatment as clinically indicated and appropriate  The patient will follow up with their current PCP if and as advised. If the patient does not currently have a PCP we will assist them in obtaining one.   The patient may need specialty follow up if the symptoms continue, in spite of conservative treatment and management, for further workup, evaluation, consultation and treatment as clinically indicated and appropriate.  Patient/parent/caregiver verbalized understanding and agreement of plan as discussed.  All questions were addressed during visit.  Please see discharge instructions below for further details of plan.  Discharge Instructions:   Discharge Instructions      Please read below to learn more about the medications, dosages and frequencies that I recommend to help alleviate your symptoms and to get you feeling better  soon:   Decadron IM (dexamethasone):  To quickly address your significant respiratory inflammation, you were provided with an injection of Decadron in the office today.  You should continue to feel the full benefit of the steroid for the next 12-24 hours.    Augmentin (amoxicillin - clavulanic acid):  take 1 tablet 3 times daily for 10 days, you can take it with or without food.  This antibiotic can cause upset stomach, this will resolve once antibiotics are complete.  You are welcome to use a probiotic, eat yogurt, take Imodium while taking this medication.  Please avoid other systemic medications such as Maalox, Pepto-Bismol or milk of magnesia as they can interfere with your body's ability to absorb the antibiotics.       Diflucan (fluconazole): As you may or may not be aware, taking antibiotics can often cause patients to develop a vaginal yeast infection.  For this reason, I have provided you with a prescription for Diflucan, and antifungal medication used to treat vaginal yeast infections.  Please take the first Diflucan tablet on day 3 or 4 of your antibiotic therapy, and take the second Diflucan tablet 3 days later.  You do not need to pick up this prescription or take this medication unless you develop symptoms of vaginal yeast infection including thick, white vaginal discharge and/or vaginal itching.  This prescription has been provided as a Research officer, political party and for your convenience.   Claritin (loratadine): This is an excellent second-generation antihistamine that helps to reduce respiratory inflammatory response to environmental allergens.  This medication is not known to cause daytime sleepiness so it can be taken in the daytime.  If you find that it does make you sleepy, please feel free to take it at bedtime.   Flonase (fluticasone): This is a steroid nasal spray that you use once daily, 1 spray in each nare.  This medication does not work well if you decide to use it only used as you feel you need  to, it works best used on a daily basis.  After 3 to 5 days of use, you will notice significant reduction of the inflammation and mucus production that is currently being caused by exposure to allergens, whether seasonal or environmental.  The most common side effect of this medication is nosebleeds.  If you experience a nosebleed, please discontinue use for 1 week, then feel free to resume.  I have provided you with a prescription.    Advil, Motrin (ibuprofen): This is a good anti-inflammatory medication which not only addresses aches, pains but also significantly reduces soft tissue inflammation of the upper airways that causes sinus and nasal congestion as well as inflammation of the lower  airways which makes you feel like your breathing is constricted or your cough feel tight.  I recommend that you take 400 mg every 8 hours as needed.     Please continue using your albuterol 2 puffs 3-4 times daily to keep your lungs calm.   If you find that your health insurance will not pay for allergy medications, please consider downloading the GoodRx app and using to get a better price than the "off the shelf" price.        If you find that you have not had improvement of your symptoms in the next 5 to 7 days, please follow-up with your primary care provider or return here to urgent care for repeat evaluation and further recommendations.   Thank you for visiting urgent care today.  We appreciate the opportunity to participate in your care.          This office note has been dictated using Teaching laboratory technician.  Unfortunately, this method of dictation can sometimes lead to typographical or grammatical errors.  I apologize for your inconvenience in advance if this occurs.  Please do not hesitate to reach out to me if clarification is needed.      Theadora Rama Scales, PA-C 11/18/22 1906

## 2022-11-21 ENCOUNTER — Ambulatory Visit: Payer: BC Managed Care – PPO | Admitting: Nurse Practitioner

## 2022-11-21 ENCOUNTER — Encounter: Payer: Self-pay | Admitting: Nurse Practitioner

## 2022-11-21 VITALS — BP 120/78 | HR 80

## 2022-11-21 DIAGNOSIS — N393 Stress incontinence (female) (male): Secondary | ICD-10-CM | POA: Diagnosis not present

## 2022-11-21 NOTE — Progress Notes (Signed)
   Acute Office Visit  Subjective:    Patient ID: Erin Mays, female    DOB: 13-Dec-1981, 41 y.o.   MRN: [id number]   HPI 41 y.o. presents today for urinary incontinence. Incontinence occurs with jumping, sneezing, and coughing. She has been dealing with bronchitis for a few weeks so she the incontinence episodes have been more frequent. Typically it is just a small amount but she did have an episode a couple of months ago while jumping around with children where she had a large amount of leakage. Voids 1-2 times per night, during a work day she may go 6 hours without voiding, but on days off she will make sure to go more. No difficulty starting stream and feels she fully empties bladder. Denies dysuria, urgency, hematuria or back pain.    Review of Systems  Constitutional: Negative.   Genitourinary:        Incontinence       Objective:    Physical Exam Constitutional:      Appearance: Normal appearance.  Genitourinary:    General: Normal vulva.     Vagina: Normal.     Comments: No evidence of bladder prolapse, good contraction    BP 120/78 (BP Location: Right Arm, Patient Position: Sitting, Cuff Size: Normal)   Pulse 80   LMP 11/02/2022 (Approximate)  Wt Readings from Last 3 Encounters:  03/13/22 149 lb (67.6 kg)  10/24/21 148 lb (67.1 kg)  01/19/21 148 lb 9.6 oz (67.4 kg)        Patient informed chaperone available to be present for breast and/or pelvic exam. Patient has requested no chaperone to be present. Patient has been advised what will be completed during breast and pelvic exam.   Assessment & Plan:   Problem List Items Addressed This Visit   None Visit Diagnoses     Stress incontinence    -  Primary      Plan: Discussed stress incontinence causes and management options. Educated on lifestyle changes, pelvic floor strengthening exercises, and Pelvic floor PT. She wants to start with lifestyles changes and strengthening her pelvic floor on her own.  Will reach out if she wants to pursue PT.      Olivia Mackie DNP, 2:23 PM 11/21/2022

## 2022-12-01 ENCOUNTER — Other Ambulatory Visit: Payer: Self-pay | Admitting: Internal Medicine

## 2022-12-01 DIAGNOSIS — F418 Other specified anxiety disorders: Secondary | ICD-10-CM

## 2022-12-20 ENCOUNTER — Other Ambulatory Visit: Payer: Self-pay | Admitting: Internal Medicine

## 2022-12-20 ENCOUNTER — Other Ambulatory Visit: Payer: Self-pay

## 2022-12-20 DIAGNOSIS — F418 Other specified anxiety disorders: Secondary | ICD-10-CM

## 2022-12-20 MED ORDER — SERTRALINE HCL 100 MG PO TABS: 100.0000 mg | ORAL_TABLET | Freq: Every day | ORAL | 0 refills | Status: DC

## 2022-12-20 NOTE — Telephone Encounter (Signed)
Pt notified and voiced understanding 

## 2022-12-20 NOTE — Telephone Encounter (Signed)
Provided #30 to get her to her PCP visit. Thanks.

## 2022-12-20 NOTE — Telephone Encounter (Signed)
Pt calling to request a 30day refill on sertraline.   Last AEX 03/13/2022--scheduled for 03/18/2023.  Looks as if PCP has been managing pt's rx's for depression/anxiety. Will inquire.   Pt reports she did reach out to PCP in regards to refill but she was overdue for her annual with them. She reported making the earliest appt they had available on 01/28/2023 and stated that they declined sending her refills until her appt date. Reports she was told to go to an ER or UC for short term script. So pt just thought that since she has touched on this with you in the past, you would at least be willing to assist until she can see them. Please advise.

## 2023-01-15 DIAGNOSIS — L710 Perioral dermatitis: Secondary | ICD-10-CM | POA: Diagnosis not present

## 2023-01-16 ENCOUNTER — Other Ambulatory Visit: Payer: Self-pay | Admitting: Nurse Practitioner

## 2023-01-16 DIAGNOSIS — F418 Other specified anxiety disorders: Secondary | ICD-10-CM

## 2023-01-17 NOTE — Telephone Encounter (Signed)
RF received for sertraline 100mg  #30.  Last AEX 03/13/22.  AEX is scheduled 03/18/23.  RF sent.

## 2023-01-28 ENCOUNTER — Ambulatory Visit (INDEPENDENT_AMBULATORY_CARE_PROVIDER_SITE_OTHER): Payer: No Typology Code available for payment source | Admitting: Internal Medicine

## 2023-01-28 ENCOUNTER — Encounter: Payer: Self-pay | Admitting: Internal Medicine

## 2023-01-28 VITALS — BP 126/72 | HR 94 | Temp 98.8°F | Resp 16 | Ht 65.0 in | Wt 152.0 lb

## 2023-01-28 DIAGNOSIS — E785 Hyperlipidemia, unspecified: Secondary | ICD-10-CM

## 2023-01-28 DIAGNOSIS — F411 Generalized anxiety disorder: Secondary | ICD-10-CM

## 2023-01-28 DIAGNOSIS — F418 Other specified anxiety disorders: Secondary | ICD-10-CM

## 2023-01-28 DIAGNOSIS — Z0000 Encounter for general adult medical examination without abnormal findings: Secondary | ICD-10-CM | POA: Diagnosis not present

## 2023-01-28 DIAGNOSIS — Z1159 Encounter for screening for other viral diseases: Secondary | ICD-10-CM | POA: Insufficient documentation

## 2023-01-28 DIAGNOSIS — R69 Illness, unspecified: Secondary | ICD-10-CM | POA: Diagnosis not present

## 2023-01-28 MED ORDER — SERTRALINE HCL 100 MG PO TABS: 100.0000 mg | ORAL_TABLET | Freq: Every day | ORAL | 1 refills | Status: DC

## 2023-01-28 NOTE — Patient Instructions (Signed)

## 2023-01-28 NOTE — Progress Notes (Signed)
Subjective:  Patient ID: Erin Mays, female    DOB: Dec 30, 1980  Age: 42 y.o. MRN: [id number]  CC: Annual Exam   HPI Erin Mays presents for a CPX and f/up -----  She would like to continue taking the current dose of sertraline and occasionally takes Xanax.  She tells me that her anxiety is well-controlled.    Outpatient Medications Prior to Visit  Medication Sig Dispense Refill   albuterol (VENTOLIN HFA) 108 (90 Base) MCG/ACT inhaler Inhale into the lungs.     ALPRAZolam (XANAX) 0.5 MG tablet TAKE ONE TABLET BY MOUTH TWICE A DAY AS NEEDED FOR ANXIETY 180 tablet 0   fluconazole (DIFLUCAN) 150 MG tablet Take 1 tablet on day 4 of antibiotics.  Take second tablet 3 days later. 2 tablet 0   fluticasone (FLONASE) 50 MCG/ACT nasal spray Place 1 spray into both nostrils daily. Begin by using 2 sprays in each nare daily for 3 to 5 days, then decrease to 1 spray in each nare daily. 15.8 mL 2   ibuprofen (ADVIL) 400 MG tablet Take 1 tablet (400 mg total) by mouth every 8 (eight) hours as needed for up to 30 doses. 30 tablet 0   loratadine (CLARITIN) 10 MG tablet Take 1 tablet (10 mg total) by mouth daily. 90 tablet 1   Probiotic Product (ALIGN EXTRA STRENGTH) CAPS Take 1 capsule by mouth daily. 90 capsule 1   sertraline (ZOLOFT) 100 MG tablet TAKE ONE TABLET BY MOUTH ONE TIME DAILY 30 tablet 1   No facility-administered medications prior to visit.    ROS Review of Systems  Constitutional:  Negative for chills, diaphoresis, fatigue and fever.  HENT: Negative.    Eyes: Negative.   Respiratory:  Negative for cough, chest tightness, shortness of breath and wheezing.   Cardiovascular:  Negative for chest pain, palpitations and leg swelling.  Gastrointestinal:  Negative for abdominal pain, constipation, diarrhea and vomiting.  Endocrine: Negative.   Genitourinary: Negative.  Negative for difficulty urinating and dysuria.  Musculoskeletal: Negative.   Skin: Negative.  Negative for  color change.  Neurological: Negative.  Negative for dizziness, weakness, light-headedness and headaches.  Hematological: Negative.   Psychiatric/Behavioral:  Positive for dysphoric mood. Negative for confusion, decreased concentration, sleep disturbance and suicidal ideas. The patient is nervous/anxious.     Objective:  BP 126/72 (BP Location: Left Arm, Patient Position: Sitting, Cuff Size: Large)   Pulse 94   Temp 98.8 F (37.1 C) (Oral)   Resp 16   Ht 5' 5"$  (1.651 m)   Wt 152 lb (68.9 kg)   LMP 01/26/2023 (Exact Date)   SpO2 96%   BMI 25.29 kg/m   BP Readings from Last 3 Encounters:  01/28/23 126/72  11/21/22 120/78  11/16/22 (!) 142/84    Wt Readings from Last 3 Encounters:  01/28/23 152 lb (68.9 kg)  03/13/22 149 lb (67.6 kg)  10/24/21 148 lb (67.1 kg)    Physical Exam Vitals reviewed.  HENT:     Nose: Nose normal.     Mouth/Throat:     Mouth: Mucous membranes are moist.  Eyes:     General: No scleral icterus.    Conjunctiva/sclera: Conjunctivae normal.  Cardiovascular:     Rate and Rhythm: Normal rate and regular rhythm.     Heart sounds: No murmur heard. Pulmonary:     Effort: Pulmonary effort is normal.     Breath sounds: No stridor. No wheezing, rhonchi or rales.  Abdominal:     General:  Abdomen is flat.     Palpations: There is no mass.     Tenderness: There is no abdominal tenderness. There is no guarding.     Hernia: No hernia is present.  Musculoskeletal:        General: Normal range of motion.     Cervical back: Neck supple.     Right lower leg: No edema.     Left lower leg: No edema.  Lymphadenopathy:     Cervical: No cervical adenopathy.  Skin:    General: Skin is warm and dry.  Neurological:     General: No focal deficit present.     Mental Status: She is alert.  Psychiatric:        Mood and Affect: Mood normal.        Behavior: Behavior normal.     Lab Results  Component Value Date   WBC 8.7 03/13/2022   HGB 14.9 03/13/2022    HCT 44.1 03/13/2022   PLT 376 03/13/2022   GLUCOSE 92 03/13/2022   CHOL 216 (H) 11/29/2017   TRIG 85.0 11/29/2017   HDL 82.60 11/29/2017   LDLDIRECT 147.9 10/29/2013   LDLCALC 116 (H) 11/29/2017   ALT 10 03/13/2022   AST 14 03/13/2022   NA 141 03/13/2022   K 4.6 03/13/2022   CL 104 03/13/2022   CREATININE 0.79 03/13/2022   BUN 24 03/13/2022   CO2 25 03/13/2022   TSH 1.86 03/13/2022    No results found.  Assessment & Plan:   Erin Mays was seen today for annual exam.  Diagnoses and all orders for this visit:  GAD (generalized anxiety disorder)- Will continue the current doses of sertraline and Xanax.  Will check labs to evaluate for secondary causes. -     sertraline (ZOLOFT) 100 MG tablet; Take 1 tablet (100 mg total) by mouth daily. -     CBC with Differential/Platelet; Future -     Basic metabolic panel; Future -     Hepatic function panel; Future -     TSH; Future  Hyperlipidemia with target LDL less than 160- Will monitor her lipids and start a statin if indicated. -     CBC with Differential/Platelet; Future -     Basic metabolic panel; Future -     Hepatic function panel; Future -     TSH; Future  Routine general medical examination at a health care facility- Exam completed, labs ordered, vaccines are up-to-date, cancer screenings are up-to-date, patient education was given. -     Lipid panel; Future -     Hepatitis C antibody; Future  Need for hepatitis C screening test -     Hepatitis C antibody; Future   [name]. I am also having her maintain her ALPRAZolam, Align Extra Strength, albuterol, loratadine, ibuprofen, fluticasone, and fluconazole.  Meds ordered this encounter  Medications   sertraline (ZOLOFT) 100 MG tablet    Sig: Take 1 tablet (100 mg total) by mouth daily.    Dispense:  90 tablet    Refill:  1     Follow-up: Return in about 1 year (around 01/29/2024).  Erin Calico, MD

## 2023-02-04 DIAGNOSIS — Z1159 Encounter for screening for other viral diseases: Secondary | ICD-10-CM | POA: Diagnosis not present

## 2023-02-04 DIAGNOSIS — Z0000 Encounter for general adult medical examination without abnormal findings: Secondary | ICD-10-CM | POA: Diagnosis not present

## 2023-02-04 LAB — CBC WITH DIFFERENTIAL/PLATELET
Basophils Absolute: 0 10*3/uL (ref 0.0–0.1)
Basophils Relative: 0.1 % (ref 0.0–3.0)
Eosinophils Absolute: 0.1 10*3/uL (ref 0.0–0.7)
Eosinophils Relative: 1.6 % (ref 0.0–5.0)
HCT: 42.2 % (ref 36.0–46.0)
Hemoglobin: 14.1 g/dL (ref 12.0–15.0)
Lymphocytes Relative: 32.3 % (ref 12.0–46.0)
Lymphs Abs: 1.9 10*3/uL (ref 0.7–4.0)
MCHC: 33.3 g/dL (ref 30.0–36.0)
MCV: 89.1 fl (ref 78.0–100.0)
Monocytes Absolute: 0.5 10*3/uL (ref 0.1–1.0)
Monocytes Relative: 8.9 % (ref 3.0–12.0)
Neutro Abs: 3.4 10*3/uL (ref 1.4–7.7)
Neutrophils Relative %: 57.1 % (ref 43.0–77.0)
Platelets: 308 10*3/uL (ref 150.0–400.0)
RBC: 4.74 Mil/uL (ref 3.87–5.11)
RDW: 13.4 % (ref 11.5–15.5)
WBC: 6 10*3/uL (ref 4.0–10.5)

## 2023-02-04 LAB — HEPATIC FUNCTION PANEL
ALT: 11 U/L (ref 0–35)
AST: 16 U/L (ref 0–37)
Albumin: 4 g/dL (ref 3.5–5.2)
Alkaline Phosphatase: 56 U/L (ref 39–117)
Bilirubin, Direct: 0.1 mg/dL (ref 0.0–0.3)
Total Bilirubin: 0.5 mg/dL (ref 0.2–1.2)
Total Protein: 6.6 g/dL (ref 6.0–8.3)

## 2023-02-04 LAB — BASIC METABOLIC PANEL
BUN: 16 mg/dL (ref 6–23)
CO2: 27 mEq/L (ref 19–32)
Calcium: 9.2 mg/dL (ref 8.4–10.5)
Chloride: 105 mEq/L (ref 96–112)
Creatinine, Ser: 0.77 mg/dL (ref 0.40–1.20)
GFR: 95.41 mL/min (ref >60.00)
Glucose, Bld: 88 mg/dL (ref 70–99)
Potassium: 4.3 mEq/L (ref 3.5–5.1)
Sodium: 138 mEq/L (ref 135–145)

## 2023-02-04 LAB — LIPID PANEL
Cholesterol: 238 mg/dL — ABNORMAL HIGH (ref 0–200)
HDL: 81 mg/dL (ref >39.00)
LDL Cholesterol: 129 mg/dL — ABNORMAL HIGH (ref 0–99)
NonHDL: 157.32
Total CHOL/HDL Ratio: 3
Triglycerides: 141 mg/dL (ref 0.0–149.0)
VLDL: 28.2 mg/dL (ref 0.0–40.0)

## 2023-02-04 LAB — TSH: TSH: 1.04 u[IU]/mL (ref 0.35–5.50)

## 2023-02-05 LAB — HEPATITIS C ANTIBODY: Hepatitis C Ab: NONREACTIVE

## 2023-03-18 ENCOUNTER — Ambulatory Visit (INDEPENDENT_AMBULATORY_CARE_PROVIDER_SITE_OTHER): Payer: No Typology Code available for payment source | Admitting: Nurse Practitioner

## 2023-03-18 ENCOUNTER — Encounter: Payer: Self-pay | Admitting: Nurse Practitioner

## 2023-03-18 VITALS — BP 110/64 | HR 85 | Ht 65.5 in | Wt 146.0 lb

## 2023-03-18 DIAGNOSIS — Z01419 Encounter for gynecological examination (general) (routine) without abnormal findings: Secondary | ICD-10-CM

## 2023-03-18 NOTE — Progress Notes (Signed)
   Korie Mullins 21-Sep-1981 [id number]   History:  42 y.o. G0 presents for annual exam. Monthly cycles. Normal pap history. Anxiety and depression managed by PCP.  Gynecologic History Patient's last menstrual period was 02/18/2023.   Contraception/Family planning: rhythm method Sexually active: No  Health Maintenance Last Pap: 01/19/2020. Results were: Normal neg HPV, 5-year repeat Last mammogram: 03/28/2022. Results were: Normal Last colonoscopy: Not indicated Last Dexa: Not indicated  Past medical history, past surgical history, family history and social history were all reviewed and documented in the EPIC chart. Boyfriend of 20 years. Works in Engineer, site at [employer] was performed and pertinent positives and negatives are included.  Exam:  Vitals:   03/18/23 1453  BP: 110/64  Pulse: 85  SpO2: 100%  Weight: 146 lb (66.2 kg)  Height: 5' 5.5" (1.664 m)     Body mass index is 23.93 kg/m.  General appearance:  Normal Thyroid:  Symmetrical, normal in size, without palpable masses or nodularity. Respiratory  Auscultation:  Clear without wheezing or rhonchi Cardiovascular  Auscultation:  Regular rate, without rubs, murmurs or gallops  Edema/varicosities:  Not grossly evident Abdominal  Soft,nontender, without masses, guarding or rebound.  Liver/spleen:  No organomegaly noted  Hernia:  None appreciated  Skin  Inspection:  Grossly normal   Breasts: Examined lying and sitting.   Right: Without masses, retractions, discharge or axillary adenopathy.   Left: Without masses, retractions, discharge or axillary adenopathy. Genitourinary   Inguinal/mons:  Normal without inguinal adenopathy  External genitalia:  Normal appearing vulva with no masses, tenderness, or lesions  BUS/Urethra/Skene's glands:  Normal  Vagina:  Normal appearing with normal color and discharge, no lesions  Cervix:  Normal appearing without discharge or lesions  Uterus:  Normal in  size, shape and contour.  Midline and mobile, nontender  Adnexa/parametria:     Rt: Normal in size, without masses or tenderness.   Lt: Normal in size, without masses or tenderness.  Anus and perineum: Normal  Digital rectal exam: Not indicated  Patient informed chaperone available to be present for breast and pelvic exam. Patient has requested no chaperone to be present. Patient has been advised what will be completed during breast and pelvic exam.   Assessment/Plan:  42 y.o. G0 for annual exam.   Well female exam with routine gynecological exam - Education provided on SBEs, importance of preventative screenings, current guidelines, high calcium diet, regular exercise, and multivitamin daily. Labs with PCP.   Screening for cervical cancer - Normal Pap history.  Will repeat at 5-year interval per guidelines.  Screening for breast cancer - Normal mammogram history. Continue annual screenings. She gets these with mobile unit at work. Normal breast exam today.  Follow up in 1 year for annual.     Tamela Gammon [facility], 3:11 PM 03/18/2023

## 2023-03-22 DIAGNOSIS — K130 Diseases of lips: Secondary | ICD-10-CM | POA: Diagnosis not present

## 2023-03-22 DIAGNOSIS — L814 Other melanin hyperpigmentation: Secondary | ICD-10-CM | POA: Diagnosis not present

## 2023-03-22 DIAGNOSIS — L821 Other seborrheic keratosis: Secondary | ICD-10-CM | POA: Diagnosis not present

## 2023-03-22 DIAGNOSIS — D2239 Melanocytic nevi of other parts of face: Secondary | ICD-10-CM | POA: Diagnosis not present

## 2023-04-03 DIAGNOSIS — R92323 Mammographic fibroglandular density, bilateral breasts: Secondary | ICD-10-CM | POA: Diagnosis not present

## 2023-04-03 DIAGNOSIS — Z1231 Encounter for screening mammogram for malignant neoplasm of breast: Secondary | ICD-10-CM | POA: Diagnosis not present

## 2023-05-06 DIAGNOSIS — J209 Acute bronchitis, unspecified: Secondary | ICD-10-CM | POA: Diagnosis not present

## 2023-05-16 ENCOUNTER — Telehealth: Payer: Self-pay | Admitting: Internal Medicine

## 2023-05-16 ENCOUNTER — Other Ambulatory Visit: Payer: Self-pay | Admitting: Internal Medicine

## 2023-05-16 DIAGNOSIS — F411 Generalized anxiety disorder: Secondary | ICD-10-CM

## 2023-05-16 MED ORDER — SERTRALINE HCL 100 MG PO TABS: 200.0000 mg | ORAL_TABLET | Freq: Every day | ORAL | 0 refills | Status: DC

## 2023-05-16 NOTE — Telephone Encounter (Signed)
Patient would like to know if you will up her sertaline(zoloft) to 2 pills a day.  Please send this to [facility] if this is okay with you. Please call patient and let her know.  [phone]

## 2023-05-28 DIAGNOSIS — Z131 Encounter for screening for diabetes mellitus: Secondary | ICD-10-CM | POA: Diagnosis not present

## 2023-05-28 DIAGNOSIS — N951 Menopausal and female climacteric states: Secondary | ICD-10-CM | POA: Diagnosis not present

## 2023-05-28 DIAGNOSIS — R5383 Other fatigue: Secondary | ICD-10-CM | POA: Diagnosis not present

## 2023-05-28 DIAGNOSIS — E7800 Pure hypercholesterolemia, unspecified: Secondary | ICD-10-CM | POA: Diagnosis not present

## 2023-05-30 DIAGNOSIS — N951 Menopausal and female climacteric states: Secondary | ICD-10-CM | POA: Diagnosis not present

## 2023-05-30 DIAGNOSIS — N644 Mastodynia: Secondary | ICD-10-CM | POA: Diagnosis not present

## 2023-05-30 DIAGNOSIS — F411 Generalized anxiety disorder: Secondary | ICD-10-CM | POA: Diagnosis not present

## 2023-05-30 DIAGNOSIS — G479 Sleep disorder, unspecified: Secondary | ICD-10-CM | POA: Diagnosis not present

## 2023-05-30 DIAGNOSIS — Z79890 Hormone replacement therapy (postmenopausal): Secondary | ICD-10-CM | POA: Diagnosis not present

## 2023-05-30 DIAGNOSIS — R5383 Other fatigue: Secondary | ICD-10-CM | POA: Diagnosis not present

## 2023-05-30 DIAGNOSIS — Z6823 Body mass index (BMI) 23.0-23.9, adult: Secondary | ICD-10-CM | POA: Diagnosis not present

## 2023-07-10 DIAGNOSIS — N951 Menopausal and female climacteric states: Secondary | ICD-10-CM | POA: Diagnosis not present

## 2023-07-15 DIAGNOSIS — R5383 Other fatigue: Secondary | ICD-10-CM | POA: Diagnosis not present

## 2023-07-15 DIAGNOSIS — Z6824 Body mass index (BMI) 24.0-24.9, adult: Secondary | ICD-10-CM | POA: Diagnosis not present

## 2023-07-15 DIAGNOSIS — N951 Menopausal and female climacteric states: Secondary | ICD-10-CM | POA: Diagnosis not present

## 2023-07-15 DIAGNOSIS — E7800 Pure hypercholesterolemia, unspecified: Secondary | ICD-10-CM | POA: Diagnosis not present

## 2023-07-17 ENCOUNTER — Encounter (INDEPENDENT_AMBULATORY_CARE_PROVIDER_SITE_OTHER): Payer: Self-pay

## 2023-07-22 ENCOUNTER — Ambulatory Visit: Payer: No Typology Code available for payment source | Admitting: Internal Medicine

## 2023-07-22 ENCOUNTER — Encounter: Payer: Self-pay | Admitting: Internal Medicine

## 2023-07-22 VITALS — BP 110/78 | HR 90 | Temp 98.1°F | Resp 16 | Ht 65.5 in | Wt 150.0 lb

## 2023-07-22 DIAGNOSIS — F900 Attention-deficit hyperactivity disorder, predominantly inattentive type: Secondary | ICD-10-CM | POA: Diagnosis not present

## 2023-07-22 DIAGNOSIS — F411 Generalized anxiety disorder: Secondary | ICD-10-CM | POA: Diagnosis not present

## 2023-07-22 DIAGNOSIS — E663 Overweight: Secondary | ICD-10-CM

## 2023-07-22 MED ORDER — INSULIN PEN NEEDLE 32G X 6 MM MISC: 1.0000 | 1 refills | Status: AC

## 2023-07-22 MED ORDER — SERTRALINE HCL 100 MG PO TABS: 150.0000 mg | ORAL_TABLET | Freq: Every day | ORAL | 1 refills | Status: DC

## 2023-07-22 MED ORDER — AMPHETAMINE-DEXTROAMPHETAMINE 5 MG PO TABS: 5.0000 mg | ORAL_TABLET | Freq: Every day | ORAL | 0 refills | Status: DC | PRN

## 2023-07-22 MED ORDER — SEMAGLUTIDE-WEIGHT MANAGEMENT 0.25 MG/0.5ML ~~LOC~~ SOAJ: 0.2500 mg | SUBCUTANEOUS | 0 refills | Status: AC

## 2023-07-22 NOTE — Patient Instructions (Signed)
Amphetamine; Dextroamphetamine Tablets What is this medication? AMPHETAMINE; DEXTROAMPHETAMINE (am FET a meen; dex troe am FET a meen) treats attention-deficit hyperactivity disorder (ADHD). It works by improving focus and reducing impulsive behavior. It may also be used to treat narcolepsy. It works by promoting wakefulness. It belongs to a group of medications called stimulants. This medicine may be used for other purposes; ask your health care provider or pharmacist if you have questions. COMMON BRAND NAME(S): Adderall What should I tell my care team before I take this medication? They need to know if you have any of these conditions: Anxiety or panic attacks Circulation problems in fingers or toes (Raynaud syndrome) Glaucoma Heart attack Heart disease High blood pressure Kidney disease Liver disease Mental health conditions Seizures Stroke Substance use disorder Suicidal thoughts, plans, or attempt by you or a family member Thyroid disease Tourette syndrome An unusual or allergic reaction to dextroamphetamine, other medications, foods, dyes, or preservatives Pregnant or trying to get pregnant Breastfeeding How should I use this medication? Take this medication by mouth. Take it as directed on the prescription label at the same time every day. You can take it with or without food. If it upsets your stomach, take it with food. Keep taking it unless your care team tells you to stop. A special MedGuide will be given to you by the pharmacist with each prescription and refill. Be sure to read this information carefully each time. Talk to your care team about the use of this medication in children. While it may be prescribed for children as young as 3 years for selected conditions, precautions do apply. Overdosage: If you think you have taken too much of this medicine contact a poison control center or emergency room at once. NOTE: This medicine is only for you. Do not share this medicine  with others. What if I miss a dose? If you miss a dose, take it as soon as you can. If it is almost time for your next dose, take only that dose. Do not take double or extra doses. What may interact with this medication? Do not take this medication with any of the following: Linezolid MAOIs, such as Marplan, Nardil, and Parnate Methylene blue This medication may also interact with the following: Acetazolamide Alcohol Ascorbic acid Certain medications for depression, anxiety, or other mental health conditions Certain medications for migraines, such as sumatriptan Guanethidine Opioids Reserpine Sodium bicarbonate St. John's wort Thiazide diuretics, such as chlorothiazide Tryptophan This list may not describe all possible interactions. Give your health care provider a list of all the medicines, herbs, non-prescription drugs, or dietary supplements you use. Also tell them if you smoke, drink alcohol, or use illegal drugs. Some items may interact with your medicine. What should I watch for while using this medication? Visit your care team for regular checks on your progress. Tell your care team if your symptoms do not start to get better or if they get worse. This medication requires a new prescription from your care team every time it is filled at the pharmacy. This medication can be abused and cause your brain and body to depend on it after high doses or long term use. Your care team will assess your risk and monitor you closely during treatment. Long term use of this medication may cause your brain and body to depend on it. You may be able to take breaks from this medication during weekends, holidays, or summer vacations. Talk to your care team about what works for you. If  your care team wants you to stop this medication permanently, the dose may be slowly lowered over time to reduce the risk of side effects. Tell your care team if this medication loses its effects, or if you feel you need  to take more than the prescribed amount. Do not change your dose without talking to your care team. Do not take this medication close to bedtime. It may prevent you from sleeping. Loss of appetite is common when starting this medication. Eating small, frequent meals or snacks can help. Talk to your care team if appetite loss persists. Children should have height and weight checked often while taking this medication. Tell your care team right away if you notice unexplained wounds on your fingers and toes while taking this medication. You should also tell your care team if you experience numbness or pain, changes in the skin color, or sensitivity to temperature in your fingers or toes. Contact your care team right away if you have an erection that lasts longer than 4 hours or if it becomes painful. This may be a sign of a serious problem and must be treated right away to prevent permanent damage. What side effects may I notice from receiving this medication? Side effects that you should report to your care team as soon as possible: Allergic reactions--skin rash, itching, hives, swelling of the face, lips, tongue, or throat Heart attack--pain or tightness in the chest, shoulders, arms, or jaw, nausea, shortness of breath, cold or clammy skin, feeling faint or lightheaded Heart rhythm changes--fast or irregular heartbeat, dizziness, feeling faint or lightheaded, chest pain, trouble breathing Increase in blood pressure Irritability, confusion, fast or irregular heartbeat, muscle stiffness, twitching muscles, sweating, high fever, seizure, chills, vomiting, diarrhea, which may be signs of serotonin syndrome Mood and behavior changes--anxiety, nervousness, confusion, hallucinations, irritability, hostility, thoughts of suicide or self-harm, worsening mood, feelings of depression Prolonged or painful erection Raynaud syndrome--cool, numb, or painful fingers or toes that may change color from pale, to blue, to  red Seizures Stroke--sudden numbness or weakness of the face, arm, or leg, trouble speaking, confusion, trouble walking, loss of balance or coordination, dizziness, severe headache, change in vision Side effects that usually do not require medical attention (report these to your care team if they continue or are bothersome): Dry mouth Headache Loss of appetite with weight loss Nausea Stomach pain Trouble sleeping This list may not describe all possible side effects. Call your doctor for medical advice about side effects. You may report side effects to FDA at 1-800-FDA-1088. Where should I keep my medication? Keep out of the reach of children and pets. This medication can be abused. Keep it in a safe place to protect it from theft. Do not share it with anyone. It is only for you. Selling or giving away this medication is dangerous and against the law. Store at room temperature between 20 and 25 degrees C (68 and 77 degrees F). Protect from light and moisture. Keep container tightly closed. Get rid of any unused medication after the expiration date. This medication may cause harm and death if it is taken by other adults, children, or pets. It is important to get rid of the medication as soon as you no longer need it, or it is expired. You can do this in two ways: Take the medication to a medication take-back program. Check with your pharmacy or law enforcement to find a location. If you cannot return the medication, check the label or package insert to see  if the medication should be thrown out in the garbage or flushed down the toilet. If you are not sure, ask your care team. If it is safe to put it in the trash, take the medication out of the container. Mix the medication with cat litter, dirt, coffee grounds, or other unwanted substance. Seal the mixture in a bag or container. Put it in the trash. NOTE: This sheet is a summary. It may not cover all possible information. If you have questions about  this medicine, talk to your doctor, pharmacist, or health care provider.  2024 Elsevier/Gold Standard (2022-10-31 00:00:00)

## 2023-07-22 NOTE — Progress Notes (Signed)
Subjective:  Patient ID: Erin Mays, female    DOB: April 07, 1981  Age: 42 y.o. MRN: [id number]  CC: Follow-up   HPI Erin Mays presents for f/up ----  Discussed the use of AI scribe software for clinical note transcription with the patient, who gave verbal consent to proceed.  History of Present Illness   The patient presents with a primary concern of difficulty focusing during work meetings, a problem she has experienced for as long as she can remember. 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 The patient describes herself as a fidgety individual and reports zoning out during meetings, often thinking about multiple other things. She denies any impact on her overall productivity outside of these meetings, where she can concentrate, focus, and take notes. 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  The patient has never been formally tested for ADHD but has taken an online test that suggested a moderate level of ADHD. She has never received treatment for this. Outside of work, the patient reports that her lack of focus affects her relationships, particularly in terms of listening.  In terms of other health concerns, the patient is currently on hormone replacement therapy, including progesterone and testosterone pellets, which she started two months ago. She also takes Xanax as needed, approximately once or twice a week, primarily for sleep. The patient is also on a 150 mg daily dose of Zoloft, which she reports liking. The patient is not sexually active, and her last menstrual cycle was a week ago. She reports no impact of the hormone replacement therapy on her menstrual cycles. Her sister, a physician in [place], recently prescribed semaglutide but the prescription is not valid in [place].       Outpatient Medications Prior to Visit   Medication Sig Dispense Refill   ALPRAZolam (XANAX) 0.5 MG tablet TAKE ONE TABLET BY MOUTH TWICE A DAY AS NEEDED FOR ANXIETY 180 tablet 0   b complex vitamins capsule Take 1 capsule by mouth daily.     Cholecalciferol (VITAMIN D-3 PO) Take by mouth.     fluticasone (FLONASE) 50 MCG/ACT nasal spray Place 1 spray into both nostrils daily. Begin by using 2 sprays in each nare daily for 3 to 5 days, then decrease to 1 spray in each nare daily. 15.8 mL 2   Multiple Vitamin (MULTIVITAMIN ADULT PO) Take by mouth.     Probiotic Product (ALIGN EXTRA STRENGTH) CAPS Take 1 capsule by mouth daily. 90 capsule 1   ibuprofen (ADVIL) 400 MG tablet Take 1 tablet (400 mg total) by mouth every 8 (eight) hours as needed for up to 30 doses. 30 tablet 0   sertraline (ZOLOFT) 100 MG tablet Take 2 tablets (200 mg total) by mouth daily. 180 tablet 0   loratadine (CLARITIN) 10 MG tablet Take 1 tablet (10 mg total) by mouth daily. 90 tablet 1   No facility-administered medications prior to visit.    ROS Review of Systems  Constitutional:  Negative for chills, diaphoresis, fatigue and fever.  HENT: Negative.    Respiratory: Negative.    Cardiovascular:  Negative for chest pain, palpitations and leg swelling.  Gastrointestinal:  Negative for abdominal pain, constipation, diarrhea, nausea and vomiting.  Genitourinary: Negative.  Negative for difficulty urinating.  Musculoskeletal: Negative.  Negative for arthralgias, back pain and neck pain.  Skin: Negative.  Negative for color change.  Neurological:  Negative.  Negative for dizziness and weakness.  Hematological:  Negative for adenopathy. Does not bruise/bleed easily.  Psychiatric/Behavioral:  Positive for decreased concentration. Negative for behavioral problems, confusion, dysphoric mood, self-injury, sleep disturbance and suicidal ideas. The patient is nervous/anxious. The patient is not hyperactive.     Objective:  BP 110/78 (BP Location: Left Arm, Patient  Position: Sitting, Cuff Size: Large)   Pulse 90   Temp 98.1 F (36.7 C) (Oral)   Resp 16   Ht 5' 5.5" (1.664 m)   Wt 150 lb (68 kg)   LMP 07/15/2023 (Exact Date)   SpO2 97%   BMI 24.58 kg/m   BP Readings from Last 3 Encounters:  07/22/23 110/78  03/18/23 110/64  01/28/23 126/72    Wt Readings from Last 3 Encounters:  07/22/23 150 lb (68 kg)  03/18/23 146 lb (66.2 kg)  01/28/23 152 lb (68.9 kg)    Physical Exam Vitals reviewed.  Constitutional:      Appearance: Normal appearance.  HENT:     Nose: Nose normal.     Mouth/Throat:     Mouth: Mucous membranes are moist.  Eyes:     General: No scleral icterus.    Conjunctiva/sclera: Conjunctivae normal.  Cardiovascular:     Rate and Rhythm: Normal rate and regular rhythm.     Heart sounds: No murmur heard. Pulmonary:     Effort: Pulmonary effort is normal.     Breath sounds: No stridor. No wheezing, rhonchi or rales.  Abdominal:     General: Abdomen is flat.     Palpations: There is no mass.     Tenderness: There is no abdominal tenderness. There is no guarding.     Hernia: No hernia is present.  Musculoskeletal:        General: Normal range of motion.     Cervical back: Neck supple.     Right lower leg: No edema.     Left lower leg: No edema.  Lymphadenopathy:     Cervical: No cervical adenopathy.  Skin:    General: Skin is warm and dry.  Neurological:     General: No focal deficit present.     Mental Status: She is alert. Mental status is at baseline.  Psychiatric:        Mood and Affect: Mood normal.        Behavior: Behavior normal.        Thought Content: Thought content normal.        Judgment: Judgment normal.     Lab Results  Component Value Date   WBC 6.0 02/04/2023   HGB 14.1 02/04/2023   HCT 42.2 02/04/2023   PLT 308.0 02/04/2023   GLUCOSE 88 02/04/2023   CHOL 238 (H) 02/04/2023   TRIG 141.0 02/04/2023   HDL 81.00 02/04/2023   LDLDIRECT 147.9 10/29/2013   LDLCALC 129 (H) 02/04/2023    ALT 11 02/04/2023   AST 16 02/04/2023   NA 138 02/04/2023   K 4.3 02/04/2023   CL 105 02/04/2023   CREATININE 0.77 02/04/2023   BUN 16 02/04/2023   CO2 27 02/04/2023   TSH 1.04 02/04/2023    No results found.  Assessment & Plan:   ADHD (attention deficit hyperactivity disorder), inattentive type -     Amphetamine-Dextroamphetamine; Take 1 tablet (5 mg total) by mouth daily as needed.  Dispense: 30 tablet; Refill: 0  Overweight -     Semaglutide-Weight Management; Inject 0.25 mg into the skin once a week for 28  days.  Dispense: 2 mL; Refill: 0 -     Insulin Pen Needle; 1 Act by Does not apply route once a week.  Dispense: 30 each; Refill: 1  GAD (generalized anxiety disorder)- Will continue the current dose of the SSRI. -     Sertraline HCl; Take 1.5 tablets (150 mg total) by mouth daily.  Dispense: 180 tablet; Refill: 1     Follow-up: Return in about 3 months (around 10/22/2023).  Sanda Linger, MD

## 2023-07-29 ENCOUNTER — Telehealth: Payer: Self-pay

## 2023-07-29 ENCOUNTER — Encounter: Payer: Self-pay | Admitting: Internal Medicine

## 2023-07-29 NOTE — Telephone Encounter (Signed)
Pharmacy Patient Advocate Encounter   Received notification from Patient Advice Request messages that prior authorization for Wegovy 0.25MG /0.5ML is required/requested.   Insurance verification completed.   The patient is insured through [employer] [facility] .   Per test claim: PA required; PA submitted to [employer] [facility] via CoverMyMeds Key/confirmation #/EOC [id number] Status is pending

## 2023-07-30 NOTE — Telephone Encounter (Signed)
Pharmacy Patient Advocate Encounter  Received notification from [facility] that Prior Authorization for [facility] has been DENIED. Please advise how you'd like to proceed. Full denial letter will be uploaded to the media tab. See denial reason below.

## 2023-07-31 DIAGNOSIS — H5022 Vertical strabismus, left eye: Secondary | ICD-10-CM | POA: Diagnosis not present

## 2023-08-08 ENCOUNTER — Ambulatory Visit: Payer: No Typology Code available for payment source | Admitting: Internal Medicine

## 2023-08-23 ENCOUNTER — Telehealth: Payer: Self-pay | Admitting: Internal Medicine

## 2023-08-23 ENCOUNTER — Other Ambulatory Visit: Payer: Self-pay | Admitting: Internal Medicine

## 2023-08-23 DIAGNOSIS — F900 Attention-deficit hyperactivity disorder, predominantly inattentive type: Secondary | ICD-10-CM

## 2023-08-23 MED ORDER — AMPHETAMINE-DEXTROAMPHETAMINE 5 MG PO TABS: 5.0000 mg | ORAL_TABLET | Freq: Every day | ORAL | 0 refills | Status: DC | PRN

## 2023-08-23 NOTE — Telephone Encounter (Signed)
Prescription Request  08/23/2023  LOV: 07/22/2023  What is the name of the medication or equipment?  amphetamine-dextroamphetamine (ADDERALL) 5 MG tablet    Have you contacted your pharmacy to request a refill? No   Which pharmacy would you like this sent to?  [id number] - [place], [place] - [address] [place] [employer] [place] [address] Phone: [phone] Fax: [phone]    Patient notified that their request is being sent to the clinical staff for review and that they should receive a response within 2 business days.   Please advise at Mobile [phone] (mobile)

## 2023-09-09 ENCOUNTER — Other Ambulatory Visit: Payer: Self-pay | Admitting: Internal Medicine

## 2023-09-09 DIAGNOSIS — F411 Generalized anxiety disorder: Secondary | ICD-10-CM

## 2023-09-09 DIAGNOSIS — F5104 Psychophysiologic insomnia: Secondary | ICD-10-CM

## 2023-09-26 DIAGNOSIS — N951 Menopausal and female climacteric states: Secondary | ICD-10-CM | POA: Diagnosis not present

## 2023-09-30 DIAGNOSIS — F411 Generalized anxiety disorder: Secondary | ICD-10-CM | POA: Diagnosis not present

## 2023-09-30 DIAGNOSIS — R5383 Other fatigue: Secondary | ICD-10-CM | POA: Diagnosis not present

## 2023-09-30 DIAGNOSIS — Z6823 Body mass index (BMI) 23.0-23.9, adult: Secondary | ICD-10-CM | POA: Diagnosis not present

## 2023-09-30 DIAGNOSIS — N951 Menopausal and female climacteric states: Secondary | ICD-10-CM | POA: Diagnosis not present

## 2023-10-09 DIAGNOSIS — F1010 Alcohol abuse, uncomplicated: Secondary | ICD-10-CM | POA: Diagnosis not present

## 2023-10-09 DIAGNOSIS — F4325 Adjustment disorder with mixed disturbance of emotions and conduct: Secondary | ICD-10-CM | POA: Diagnosis not present

## 2023-10-10 DIAGNOSIS — F4325 Adjustment disorder with mixed disturbance of emotions and conduct: Secondary | ICD-10-CM | POA: Diagnosis not present

## 2023-10-17 DIAGNOSIS — F4325 Adjustment disorder with mixed disturbance of emotions and conduct: Secondary | ICD-10-CM | POA: Diagnosis not present

## 2023-11-05 ENCOUNTER — Telehealth: Payer: Self-pay | Admitting: Internal Medicine

## 2023-11-05 NOTE — Telephone Encounter (Signed)
[facility] rep called and said that patient is interested in [place] testing - please test next time patient has appointment.

## 2023-11-05 NOTE — Telephone Encounter (Signed)
NOTED> Patient doesn't have any appointments scheduled at the time.

## 2023-11-05 NOTE — Telephone Encounter (Signed)
Is this the spit testing thin that we were having issues with ? If so how much is the out of pocket cost ?

## 2023-11-11 NOTE — Telephone Encounter (Signed)
Patient wants the gene sight testing done, can you place the order ?

## 2023-11-19 NOTE — Telephone Encounter (Signed)
Dr Yetta Barre are you okay with ordering the gene site testing for this patient and me having her come in to swab her for a quick Nurse visit ?

## 2023-11-19 NOTE — Telephone Encounter (Signed)
Patient called to follow up on her request. She would like a call back at [phone].

## 2023-11-20 ENCOUNTER — Ambulatory Visit: Payer: No Typology Code available for payment source

## 2023-12-02 ENCOUNTER — Telehealth: Payer: Self-pay | Admitting: Internal Medicine

## 2023-12-02 NOTE — Telephone Encounter (Signed)
Patient has been scheduled for tomorrow at 3pm.

## 2023-12-02 NOTE — Telephone Encounter (Signed)
Pt states her nurse gave her a call and she's returning the call. Nurse was rooming a pt and will cb at her earliest convenience..   Please advise, Thanks  CB # [phone]

## 2023-12-03 ENCOUNTER — Ambulatory Visit: Payer: No Typology Code available for payment source | Admitting: Internal Medicine

## 2023-12-03 ENCOUNTER — Encounter: Payer: Self-pay | Admitting: Internal Medicine

## 2023-12-03 VITALS — BP 108/68 | HR 78 | Temp 97.8°F | Resp 16 | Ht 65.5 in | Wt 145.0 lb

## 2023-12-03 DIAGNOSIS — F411 Generalized anxiety disorder: Secondary | ICD-10-CM

## 2023-12-03 MED ORDER — SERTRALINE HCL 100 MG PO TABS: 50.0000 mg | ORAL_TABLET | Freq: Every day | ORAL | 0 refills | Status: DC

## 2023-12-03 MED ORDER — VILAZODONE HCL 10 MG PO TABS: 10.0000 mg | ORAL_TABLET | Freq: Every day | ORAL | 0 refills | Status: DC

## 2023-12-03 NOTE — Patient Instructions (Signed)
 Managing Anxiety, Adult  After being diagnosed with anxiety, you may be relieved to know why you have felt or behaved a certain way. You may also feel overwhelmed about the treatment ahead and what it will mean for your life. With care and support, you can manage your anxiety.  How to manage lifestyle changes  Understanding the difference between stress and anxiety  Although stress can play a role in anxiety, it is not the same as anxiety. Stress is your body's reaction to life changes and events, both good and bad. Stress is often caused by something external, such as a deadline, test, or competition. It normally goes away after the event has ended and will last just a few hours. But, stress can be ongoing and can lead to more than just stress.  Anxiety is caused by something internal, such as imagining a terrible outcome or worrying that something will go wrong that will greatly upset you. Anxiety often does not go away even after the event is over, and it can become a long-term (chronic) worry.  Lowering stress and anxiety    Talk with your health care provider or a counselor to learn more about lowering anxiety and stress. They may suggest tension-reduction techniques, such as:  Music. Spend time creating or listening to music that you enjoy and that inspires you.  Mindfulness-based meditation. Practice being aware of your normal breaths while not trying to control your breathing. It can be done while sitting or walking.  Centering prayer. Focus on a word, phrase, or sacred image that means something to you and brings you peace.  Deep breathing. Expand your stomach and inhale slowly through your nose. Hold your breath for 3-5 seconds. Then breathe out slowly, letting your stomach muscles relax.  Self-talk. Learn to notice and spot thought patterns that lead to anxiety reactions. Change those patterns to thoughts that feel peaceful.  Muscle relaxation. Take time to tense muscles and then relax them.  Choose a  tension-reduction technique that fits your lifestyle and personality. These techniques take time and practice. Set aside 5-15 minutes a day to do them. Specialized therapists can offer counseling and training in these techniques. The training to help with anxiety may be covered by some insurance plans.  Other things you can do to manage stress and anxiety include:  Keeping a stress diary. This can help you learn what triggers your reaction and then learn ways to manage your response.  Thinking about how you react to certain situations. You may not be able to control everything, but you can control your response.  Making time for activities that help you relax and not feeling guilty about spending your time in this way.  Doing visual imagery. This involves imagining or creating mental pictures to help you relax.  Practicing yoga. Through yoga poses, you can lower tension and relax.     Medicines  Medicines for anxiety include:  Antidepressant medicines. These are usually prescribed for long-term daily control.  Anti-anxiety medicines. These may be added in severe cases, especially when panic attacks occur.  When used together, medicines, psychotherapy, and tension-reduction techniques may be the most effective treatment.  Relationships  Relationships can play a big part in helping you recover. Spend more time connecting with trusted friends and family members. Think about going to couples counseling if you have a partner, taking family education classes, or going to family therapy. Therapy can help you and others better understand your anxiety.  How to recognize changes in  your anxiety  Everyone responds differently to treatment for anxiety. Recovery from anxiety happens when symptoms lessen and stop interfering with your daily life at home or work. This may mean that you will start to:  Have better concentration and focus. Worry will interfere less in your daily thinking.  Sleep better.  Be less irritable.  Have  more energy.  Have improved memory.  Try to recognize when your condition is getting worse. Contact your provider if your symptoms interfere with home or work and you feel like your condition is not improving.  Follow these instructions at home:  Activity  Exercise. Adults should:  Exercise for at least 150 minutes each week. The exercise should increase your heart rate and make you sweat (moderate-intensity exercise).  Do strengthening exercises at least twice a week.  Get the right amount and quality of sleep. Most adults need 7-9 hours of sleep each night.  Lifestyle    Eat a healthy diet that includes plenty of vegetables, fruits, whole grains, low-fat dairy products, and lean protein.  Do not eat a lot of foods that are high in fats, added sugars, or salt (sodium).  Make choices that simplify your life.  Do not use any products that contain nicotine or tobacco. These products include cigarettes, chewing tobacco, and vaping devices, such as e-cigarettes. If you need help quitting, ask your provider.  Avoid caffeine, alcohol, and certain over-the-counter cold medicines. These may make you feel worse. Ask your pharmacist which medicines to avoid.  General instructions  Take over-the-counter and prescription medicines only as told by your provider.  Keep all follow-up visits. This is to make sure you are managing your anxiety well or if you need more support.  Where to find support  You can get help and support from:  Self-help groups.  Online and [employer].  A trusted spiritual leader.  Couples counseling.  Family education classes.  Family therapy.  Where to find more information  You may find that joining a support group helps you deal with your anxiety. The following sources can help you find counselors or support groups near you:  Mental Health America: mentalhealthamerica.net  Anxiety and Depression Association of [place] (ADAA): adaa.org  The [employer] on Mental Illness (NAMI):  nami.org  Contact a health care provider if:  You have a hard time staying focused or finishing tasks.  You spend many hours a day feeling worried about everyday life.  You are very tired because you cannot stop worrying.  You start to have headaches or often feel tense.  You have chronic nausea or diarrhea.  Get help right away if:  Your heart feels like it is racing.  You have shortness of breath.  You have thoughts of hurting yourself or others.  Get help right away if you feel like you may hurt yourself or others, or have thoughts about taking your own life. Go to your nearest emergency room or:  Call 911.  Call the National Suicide Prevention Lifeline at 417-380-0019 or 988. This is open 24 hours a day.  Text the Crisis Text Line at 4151481703.  This information is not intended to replace advice given to you by your health care provider. Make sure you discuss any questions you have with your health care provider.  Document Revised: 09/11/2022 Document Reviewed: 03/26/2021  Elsevier Patient Education  2024 [employer].

## 2023-12-03 NOTE — Progress Notes (Unsigned)
   Subjective:  Patient ID: Erin Mays, female    DOB: 1981-04-08  Age: 42 y.o. MRN: [id number]  CC: Depression   HPI Kadance Weant presents for f/up --   Outpatient Medications Prior to Visit  Medication Sig Dispense Refill   ALPRAZolam (XANAX) 0.5 MG tablet TAKE ONE TABLET BY MOUTH TWICE A DAY AS NEEDED FOR ANXIETY 180 tablet 0   amphetamine-dextroamphetamine (ADDERALL) 5 MG tablet Take 1 tablet (5 mg total) by mouth daily as needed. 30 tablet 0   b complex vitamins capsule Take 1 capsule by mouth daily.     Cholecalciferol (VITAMIN D-3 PO) Take by mouth.     fluticasone (FLONASE) 50 MCG/ACT nasal spray Place 1 spray into both nostrils daily. Begin by using 2 sprays in each nare daily for 3 to 5 days, then decrease to 1 spray in each nare daily. 15.8 mL 2   Insulin Pen Needle 32G X 6 MM MISC 1 Act by Does not apply route once a week. 30 each 1   Multiple Vitamin (MULTIVITAMIN ADULT PO) Take by mouth.     Probiotic Product (ALIGN EXTRA STRENGTH) CAPS Take 1 capsule by mouth daily. 90 capsule 1   sertraline (ZOLOFT) 100 MG tablet Take 1.5 tablets (150 mg total) by mouth daily. 180 tablet 1   loratadine (CLARITIN) 10 MG tablet Take 1 tablet (10 mg total) by mouth daily. 90 tablet 1   No facility-administered medications prior to visit.    ROS Review of Systems  Objective:  BP 108/68 (BP Location: Left Arm, Patient Position: Sitting, Cuff Size: Normal)   Pulse 78   Temp 97.8 F (36.6 C) (Temporal)   Resp 16   Ht 5' 5.5" (1.664 m)   Wt 145 lb (65.8 kg)   SpO2 98%   BMI 23.76 kg/m   BP Readings from Last 3 Encounters:  12/03/23 108/68  07/22/23 110/78  03/18/23 110/64    Wt Readings from Last 3 Encounters:  12/03/23 145 lb (65.8 kg)  07/22/23 150 lb (68 kg)  03/18/23 146 lb (66.2 kg)    Physical Exam  Lab Results  Component Value Date   WBC 6.0 02/04/2023   HGB 14.1 02/04/2023   HCT 42.2 02/04/2023   PLT 308.0 02/04/2023   GLUCOSE 88 02/04/2023    CHOL 238 (H) 02/04/2023   TRIG 141.0 02/04/2023   HDL 81.00 02/04/2023   LDLDIRECT 147.9 10/29/2013   LDLCALC 129 (H) 02/04/2023   ALT 11 02/04/2023   AST 16 02/04/2023   NA 138 02/04/2023   K 4.3 02/04/2023   CL 105 02/04/2023   CREATININE 0.77 02/04/2023   BUN 16 02/04/2023   CO2 27 02/04/2023   TSH 1.04 02/04/2023    No results found.  Assessment & Plan:  GAD (generalized anxiety disorder) -     Sertraline HCl; Take 0.5 tablets (50 mg total) by mouth daily.  Dispense: 30 tablet; Refill: 0 -     Vilazodone HCl; Take 1 tablet (10 mg total) by mouth daily.  Dispense: 30 tablet; Refill: 0     Follow-up: Return in about 6 months (around 06/02/2024).  Sanda Linger, MD

## 2023-12-24 ENCOUNTER — Encounter: Payer: Self-pay | Admitting: Internal Medicine

## 2023-12-30 ENCOUNTER — Encounter: Payer: Self-pay | Admitting: Internal Medicine

## 2023-12-31 ENCOUNTER — Other Ambulatory Visit: Payer: Self-pay | Admitting: Internal Medicine

## 2023-12-31 DIAGNOSIS — F411 Generalized anxiety disorder: Secondary | ICD-10-CM

## 2023-12-31 MED ORDER — VILAZODONE HCL 20 MG PO TABS: 1.0000 | ORAL_TABLET | Freq: Every day | ORAL | 0 refills | Status: DC

## 2024-01-02 ENCOUNTER — Other Ambulatory Visit: Payer: Self-pay | Admitting: Internal Medicine

## 2024-01-02 DIAGNOSIS — F900 Attention-deficit hyperactivity disorder, predominantly inattentive type: Secondary | ICD-10-CM

## 2024-01-02 DIAGNOSIS — F411 Generalized anxiety disorder: Secondary | ICD-10-CM

## 2024-01-02 DIAGNOSIS — F5104 Psychophysiologic insomnia: Secondary | ICD-10-CM

## 2024-01-27 ENCOUNTER — Ambulatory Visit: Payer: Self-pay | Admitting: Internal Medicine

## 2024-01-27 NOTE — Telephone Encounter (Signed)
 Copied from CRM [id number]. Topic: Clinical - Red Word Triage >> Jan 27, 2024  8:02 AM Jayson Michael wrote: [facility] that prompted transfer to Nurse Triage: MENTAL HEALTH: panic attacks, constant anxiety- Hx of panic attacks, patient states that it has worsened in the last 4 weeks, she had to miss work today, stated the anxiety is overwhelming.  Chief Complaint: anxiety/panic attacks Symptoms: increased anxiety, panic attacks, feeling overwhelmed, flustered, crying, unable to focus, not able to control emotions & feeling like everyone is scaring at you Frequency: x 1 month and worsening Pertinent Negatives: Patient denies n/v Disposition: [] ED /[] Urgent Care (no appt availability in office) / [x] Appointment(In office/virtual)/ []  [facility]/ [] Home Care/ [] Refused Recommended Disposition /[] [facility]/ []  Follow-up with PCP Additional Notes: pt stated at present time being around people makes pt want to curl up in a ball - due to her emotions are everywhere.    Reason for Disposition  Patient sounds very upset or troubled to the triager  Answer Assessment - Initial Assessment Questions 1. CONCERN: "Did anything happen that prompted you to call today?"      Life partner and patient no longer together 2. ANXIETY SYMPTOMS: "Can you describe how you (your loved one; patient) have been feeling?" (e.g., tense, restless, panicky, anxious, keyed up, overwhelmed, sense of impending doom).      Overwhelmed, flustered, wanting to scream, unable to focus at times, feel like people scaring, chest pain & breathing issues during, flushed   3. ONSET: "How long have you been feeling this way?" (e.g., hours, days, weeks)     Anxiety that leads to Panic attacks have increased over the last month due to leaving her life partner 4. SEVERITY: "How would you rate the level of anxiety?" (e.g., 0 - 10; or mild, moderate, severe).     Anxiety severe 5. FUNCTIONAL IMPAIRMENT: "How have these feelings  affected your ability to do daily activities?" "Have you had more difficulty than usual doing your normal daily activities?" (e.g., getting better, same, worse; self-care, school, work, interactions)     Right now patient would like to get a work note to work from home while going through emotional 6. HISTORY: "Have you felt this way before?" "Have you ever been diagnosed with an anxiety problem in the past?" (e.g., generalized anxiety disorder, panic attacks, PTSD). If Yes, ask: "How was this problem treated?" (e.g., medicines, counseling, etc.)     Yes - have medications prescribed  7. RISK OF HARM - SUICIDAL IDEATION: "Do you ever have thoughts of hurting or killing yourself?" If Yes, ask:  "Do you have these feelings now?" "Do you have a plan on how you would do this?"     no 8. TREATMENT:  "What has been done so far to treat this anxiety?" (e.g., medicines, relaxation strategies). "What has helped?"     Medication  9. TREATMENT - THERAPIST: "Do you have a counselor or therapist? Name?"    Starting counseling tomorrow 10. POTENTIAL TRIGGERS: "Do you drink caffeinated beverages (e.g., coffee, colas, teas), and how much daily?" "Do you drink alcohol or use any drugs?" "Have you started any new medicines recently?"      Ending of relationship with life partner 30. PATIENT SUPPORT: "Who is with you now?" "Who do you live with?" "Do you have family or friends who you can talk to?"       Starting  Therapy today 12. OTHER SYMPTOMS: "Do you have any other symptoms?" (e.g., feeling depressed,  trouble concentrating, trouble sleeping, trouble breathing, palpitations or fast heartbeat, chest pain, sweating, nausea, or diarrhea)       Trouble breathing, chest pain, feeling flushed 13. PREGNANCY: "Is there any chance you are pregnant?" "When was your last menstrual period?"       N/a  Protocols used: Anxiety and Panic Attack-A-AH

## 2024-01-28 ENCOUNTER — Ambulatory Visit: Payer: No Typology Code available for payment source | Admitting: Internal Medicine

## 2024-02-04 ENCOUNTER — Encounter: Payer: Self-pay | Admitting: Internal Medicine

## 2024-02-04 ENCOUNTER — Other Ambulatory Visit: Payer: Self-pay | Admitting: Internal Medicine

## 2024-02-27 ENCOUNTER — Other Ambulatory Visit: Payer: Self-pay | Admitting: Internal Medicine

## 2024-02-27 DIAGNOSIS — F411 Generalized anxiety disorder: Secondary | ICD-10-CM

## 2024-03-24 DIAGNOSIS — F419 Anxiety disorder, unspecified: Secondary | ICD-10-CM | POA: Insufficient documentation

## 2024-03-24 LAB — CBC: RBC: 4.81 (ref 3.87–5.11)

## 2024-03-24 LAB — HEPATIC FUNCTION PANEL
ALT: 10 U/L (ref 7–35)
AST: 15 (ref 13–35)
Alkaline Phosphatase: 55 (ref 25–125)
Bilirubin, Total: 1.3

## 2024-03-24 LAB — HM PAP SMEAR

## 2024-03-24 LAB — CBC AND DIFFERENTIAL
HCT: 43 (ref 36–46)
Hemoglobin: 14.3 (ref 12.0–16.0)
Platelets: 365 K/uL (ref 150–400)
WBC: 6.9

## 2024-03-24 LAB — BASIC METABOLIC PANEL WITH GFR
BUN: 13 (ref 4–21)
CO2: 27 — AB (ref 13–22)
Chloride: 102 (ref 99–108)
Creatinine: 0.8 (ref 0.5–1.1)
Glucose: 73
Potassium: 4.5 meq/L (ref 3.5–5.1)
Sodium: 137 (ref 137–147)

## 2024-03-24 LAB — LIPID PANEL
Cholesterol: 260 — AB (ref 0–200)
HDL: 98 — AB (ref 35–70)
LDL Cholesterol: 143
Triglycerides: 87 (ref 40–160)

## 2024-03-24 LAB — HEMOGLOBIN A1C: Hemoglobin A1C: 5.3

## 2024-03-24 LAB — COMPREHENSIVE METABOLIC PANEL WITH GFR: Calcium: 9.3 (ref 8.7–10.7)

## 2024-03-24 LAB — TSH: TSH: 1.67 (ref 0.41–5.90)

## 2024-03-31 LAB — HM MAMMOGRAPHY: HM Mammogram: NORMAL (ref 0–4)

## 2024-06-04 ENCOUNTER — Other Ambulatory Visit: Payer: Self-pay | Admitting: Internal Medicine

## 2024-06-04 DIAGNOSIS — F900 Attention-deficit hyperactivity disorder, predominantly inattentive type: Secondary | ICD-10-CM

## 2024-06-07 ENCOUNTER — Other Ambulatory Visit: Payer: Self-pay | Admitting: Internal Medicine

## 2024-06-07 DIAGNOSIS — F411 Generalized anxiety disorder: Secondary | ICD-10-CM

## 2024-07-16 ENCOUNTER — Ambulatory Visit: Admitting: Internal Medicine

## 2024-07-16 ENCOUNTER — Encounter: Payer: Self-pay | Admitting: Internal Medicine

## 2024-07-16 VITALS — BP 120/66 | HR 88 | Temp 98.6°F | Resp 16 | Ht 65.5 in | Wt 148.8 lb

## 2024-07-16 DIAGNOSIS — F900 Attention-deficit hyperactivity disorder, predominantly inattentive type: Secondary | ICD-10-CM

## 2024-07-16 DIAGNOSIS — Z23 Encounter for immunization: Secondary | ICD-10-CM | POA: Diagnosis not present

## 2024-07-16 DIAGNOSIS — Z0001 Encounter for general adult medical examination with abnormal findings: Secondary | ICD-10-CM | POA: Diagnosis not present

## 2024-07-16 MED ORDER — AMPHETAMINE-DEXTROAMPHETAMINE 5 MG PO TABS: 1.0000 | ORAL_TABLET | Freq: Every day | ORAL | 0 refills | Status: DC | PRN

## 2024-07-16 NOTE — Patient Instructions (Addendum)
 You received your first Hepatitis B vaccine today. Please return for the second dose in one month.    Generalized Anxiety Disorder, Adult Generalized anxiety disorder (GAD) is a mental health condition. Unlike normal worries, anxiety related to GAD is not triggered by a specific event. These worries do not fade or get better with time. GAD interferes with relationships, work, and school. GAD symptoms can vary from mild to severe. People with severe GAD can have intense waves of anxiety with physical symptoms that are similar to panic attacks. What are the causes? The exact cause of GAD is not known, but the following are believed to have an impact: Differences in natural brain chemicals. Genes passed down from parents to children. Differences in the way threats are perceived. Development and stress during childhood. Personality. What increases the risk? The following factors may make you more likely to develop this condition: Being female. Having a family history of anxiety disorders. Being very shy. Experiencing very stressful life events, such as the death of a loved one. Having a very stressful family environment. What are the signs or symptoms? People with GAD often worry excessively about many things in their lives, such as their health and family. Symptoms may also include: Mental and emotional symptoms: Worrying excessively about natural disasters. Fear of being late. Difficulty concentrating. Fears that others are judging your performance. Physical symptoms: Fatigue. Headaches, muscle tension, muscle twitches, trembling, or feeling shaky. Feeling like your heart is pounding or beating very fast. Feeling out of breath or like you cannot take a deep breath. Having trouble falling asleep or staying asleep, or experiencing restlessness. Sweating. Nausea, diarrhea, or irritable bowel syndrome (IBS). Behavioral symptoms: Experiencing erratic moods or irritability. Avoidance of  new situations. Avoidance of people. Extreme difficulty making decisions. How is this diagnosed? This condition is diagnosed based on your symptoms and medical history. You will also have a physical exam. Your health care provider may perform tests to rule out other possible causes of your symptoms. To be diagnosed with GAD, a person must have anxiety that: Is out of his or her control. Affects several different aspects of his or her life, such as work and relationships. Causes distress that makes him or her unable to take part in normal activities. Includes at least three symptoms of GAD, such as restlessness, fatigue, trouble concentrating, irritability, muscle tension, or sleep problems. Before your health care provider can confirm a diagnosis of GAD, these symptoms must be present more days than they are not, and they must last for 6 months or longer. How is this treated? This condition may be treated with: Medicine. Antidepressant medicine is usually prescribed for long-term daily control. Anti-anxiety medicines may be added in severe cases, especially when panic attacks occur. Talk therapy (psychotherapy). Certain types of talk therapy can be helpful in treating GAD by providing support, education, and guidance. Options include: Cognitive behavioral therapy (CBT). People learn coping skills and self-calming techniques to ease their physical symptoms. They learn to identify unrealistic thoughts and behaviors and to replace them with more appropriate thoughts and behaviors. Acceptance and commitment therapy (ACT). This treatment teaches people how to be mindful as a way to cope with unwanted thoughts and feelings. Biofeedback. This process trains you to manage your body's response (physiological response) through breathing techniques and relaxation methods. You will work with a therapist while machines are used to monitor your physical symptoms. Stress management techniques. These include yoga,  meditation, and exercise. A mental health specialist can help  determine which treatment is best for you. Some people see improvement with one type of therapy. However, other people require a combination of therapies. Follow these instructions at home: Lifestyle Maintain a consistent routine and schedule. Anticipate stressful situations. Create a plan and allow extra time to work with your plan. Practice stress management or self-calming techniques that you have learned from your therapist or your health care provider. Exercise regularly and spend time outdoors. Eat a healthy diet that includes plenty of vegetables, fruits, whole grains, low-fat dairy products, and lean protein. Do not eat a lot of foods that are high in fat, added sugar, or salt (sodium). Drink plenty of water. Avoid alcohol. Alcohol can increase anxiety. Avoid caffeine and certain over-the-counter cold medicines. These may make you feel worse. Ask your pharmacist which medicines to avoid. General instructions Take over-the-counter and prescription medicines only as told by your health care provider. Understand that you are likely to have setbacks. Accept this and be kind to yourself as you persist to take better care of yourself. Anticipate stressful situations. Create a plan and allow extra time to work with your plan. Recognize and accept your accomplishments, even if you judge them as small. Spend time with people who care about you. Keep all follow-up visits. This is important. Where to find more information [employer] of Mental Health: http://www.maynard.net/ Substance Abuse and Mental Health Services: Sk[employer].com.pt Contact a health care provider if: Your symptoms do not get better. Your symptoms get worse. You have signs of depression, such as: A persistently sad or irritable mood. Loss of enjoyment in activities that used to bring you joy. Change in weight or eating. Changes in sleeping habits. Get help right  away if: You have thoughts about hurting yourself or others. If you ever feel like you may hurt yourself or others, or have thoughts about taking your own life, get help right away. Go to your nearest emergency department or: Call your local emergency services (911 in the [place] Call a suicide crisis helpline, such as the National Suicide Prevention Lifeline at 206-526-0839 or 988 in the [place] This is open 24 hours a day in the [place] If you're a Veteran: Call 988 and press 1. This is open 24 hours a day. Text the [employer] at 904 841 2783. Summary Generalized anxiety disorder (GAD) is a mental health condition that involves worry that is not triggered by a specific event. People with GAD often worry excessively about many things in their lives, such as their health and family. GAD may cause symptoms such as restlessness, trouble concentrating, sleep problems, frequent sweating, nausea, diarrhea, headaches, and trembling or muscle twitching. A mental health specialist can help determine which treatment is best for you. Some people see improvement with one type of therapy. However, other people require a combination of therapies. This information is not intended to replace advice given to you by your health care provider. Make sure you discuss any questions you have with your health care provider. Document Revised: 07/18/2023 Document Reviewed: 03/26/2021 Elsevier Patient Education  2024 [employer].

## 2024-07-17 DIAGNOSIS — Z23 Encounter for immunization: Secondary | ICD-10-CM | POA: Insufficient documentation

## 2024-07-17 NOTE — Progress Notes (Signed)
 Subjective:  Patient ID: Erin Mays, female    DOB: October 12, 1981  Age: 43 y.o. MRN: [id number]  CC: Medication Refill and Annual Exam   HPI Erin Mays presents for a CPX and f/up ---  Discussed the use of AI scribe software for clinical note transcription with the patient, who gave verbal consent to proceed.  History of Present Illness Erin Mays is a 43 year old female who presents for medication management and follow-up on birth control use.  She recently switched from Zoloft  to vilazodone  and prefers vilazodone , stating it works better. Her mood has been stable and good since the switch.  She started taking Ortho Tri-Cyclen birth control three weeks ago to regulate her menstrual cycle and reduce the frequency of her periods to once a quarter. However, she has experienced prolonged bleeding, having her period for two weeks since starting the birth control on the 20th of the month. She usually feels down before her period, but this did not occur this time.  She takes amphetamines a few times a week, primarily for work-related focus, and reports no side effects such as anxiety, insomnia, palpitations, or weight loss. 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  Her last mammogram was in April or May of this year, and she had a Pap smear done at work, where she also conducted blood work for cholesterol.    Outpatient Medications Prior to Visit  Medication Sig Dispense Refill   ALPRAZolam  (XANAX ) 0.5 MG tablet TAKE ONE TABLET BY MOUTH TWICE DAILY AS NEEDED FOR ANXIETY 180 tablet 0   b complex vitamins capsule Take 1 capsule by mouth daily.     Cholecalciferol (VITAMIN D -3 PO) Take by mouth.     fluticasone  (FLONASE ) 50 MCG/ACT nasal spray Place 1 spray into both nostrils daily. Begin by using 2 sprays in each nare daily for 3 to 5 days, then decrease to  1 spray in each nare daily. 15.8 mL 2   Insulin  Pen Needle 32G X 6 MM MISC 1 Act by Does not apply route once a week. 30 each 1   loratadine  (CLARITIN ) 10 MG tablet Take 1 tablet (10 mg total) by mouth daily. 90 tablet 1   Multiple Vitamin (MULTIVITAMIN ADULT PO) Take by mouth.     Norgestim-Eth Estrad Triphasic (NORGESTIMATE-ETHINYL ESTRADIOL  TRIPHASIC) 0.18/0.215/0.25 MG-25 MCG tab Take 1 tablet by mouth.     Probiotic Product (ALIGN EXTRA STRENGTH) CAPS Take 1 capsule by mouth daily. 90 capsule 1   Vilazodone  HCl 20 MG TABS TAKE ONE TABLET BY MOUTH ONCE A DAY 90 tablet 0   amphetamine -dextroamphetamine  (ADDERALL) 5 MG tablet TAKE ONE TABLET BY MOUTH DAILY AS NEEDED 30 tablet 0   No facility-administered medications prior to visit.    ROS Review of Systems  Constitutional: Negative.  Negative for appetite change, chills, diaphoresis and fatigue.  HENT: Negative.    Eyes: Negative.   Respiratory: Negative.  Negative for cough, chest tightness, shortness of breath and wheezing.   Cardiovascular:  Negative for chest pain, palpitations and leg swelling.  Gastrointestinal: Negative.  Negative for abdominal pain, blood in stool, constipation, diarrhea, nausea and vomiting.  Genitourinary:  Negative for difficulty urinating.  Musculoskeletal: Negative.  Negative for arthralgias and myalgias.  Skin: Negative.   Neurological:  Negative for dizziness and weakness.  Hematological:  Negative for adenopathy. Does not bruise/bleed easily.  Psychiatric/Behavioral:  Positive for  decreased concentration. Negative for behavioral problems, confusion, hallucinations, self-injury, sleep disturbance and suicidal ideas. The patient is nervous/anxious. The patient is not hyperactive.     Objective:  BP 120/66 (BP Location: Left Arm, Patient Position: Sitting, Cuff Size: Normal)   Pulse 88   Temp 98.6 F (37 C) (Oral)   Resp 16   Ht 5' 5.5 (1.664 m)   Wt 148 lb 12.8 oz (67.5 kg)   LMP 07/05/2024    SpO2 99%   BMI 24.39 kg/m   BP Readings from Last 3 Encounters:  07/16/24 120/66  12/03/23 108/68  07/22/23 110/78    Wt Readings from Last 3 Encounters:  07/16/24 148 lb 12.8 oz (67.5 kg)  12/03/23 145 lb (65.8 kg)  07/22/23 150 lb (68 kg)    Physical Exam Vitals reviewed.  Constitutional:      Appearance: Normal appearance.  HENT:     Nose: Nose normal.     Mouth/Throat:     Mouth: Mucous membranes are moist.  Eyes:     General: No scleral icterus.    Conjunctiva/sclera: Conjunctivae normal.  Cardiovascular:     Rate and Rhythm: Normal rate and regular rhythm.     Heart sounds: No murmur heard.    No friction rub. No gallop.  Pulmonary:     Effort: Pulmonary effort is normal.     Breath sounds: No stridor. No wheezing, rhonchi or rales.  Abdominal:     General: Abdomen is flat. There is no distension.     Palpations: There is no hepatomegaly, splenomegaly or mass.     Tenderness: There is no abdominal tenderness. There is no guarding.     Hernia: No hernia is present.  Musculoskeletal:        General: Normal range of motion.     Cervical back: Neck supple.     Right lower leg: No edema.     Left lower leg: No edema.  Lymphadenopathy:     Cervical: No cervical adenopathy.  Skin:    General: Skin is warm and dry.  Neurological:     General: No focal deficit present.     Mental Status: She is alert.  Psychiatric:        Mood and Affect: Mood normal.        Behavior: Behavior normal.        Thought Content: Thought content normal.        Judgment: Judgment normal.     Lab Results  Component Value Date   WBC 6.9 03/24/2024   HGB 14.3 03/24/2024   HCT 43 03/24/2024   PLT 365 03/24/2024   GLUCOSE 88 02/04/2023   CHOL 260 (A) 03/24/2024   TRIG 87 03/24/2024   HDL 98 (A) 03/24/2024   LDLDIRECT 147.9 10/29/2013   LDLCALC 143 03/24/2024   ALT 10 03/24/2024   AST 15 03/24/2024   NA 137 03/24/2024   K 4.5 03/24/2024   CL 102 03/24/2024   CREATININE 0.8  03/24/2024   BUN 13 03/24/2024   CO2 27 (A) 03/24/2024   TSH 1.67 03/24/2024   HGBA1C 5.3 03/24/2024    No results found.  Assessment & Plan:  Immunization due -     Heplisav-B  (HepB-CPG) Vaccine -     Pneumococcal conjugate vaccine 20-valent  ADHD (attention deficit hyperactivity disorder), inattentive type -     Amphetamine -Dextroamphetamine ; Take 1 tablet (5 mg total) by mouth daily as needed.  Dispense: 30 tablet; Refill: 0  Encounter for general adult  medical examination with abnormal findings- Exam completed, labs reviewed , vaccines reviewed and updated, cancer screenings are UTD, pt ed material was given.      Follow-up: Return in about 6 months (around 01/16/2025).  Debby Molt, MD

## 2024-10-16 ENCOUNTER — Other Ambulatory Visit: Payer: Self-pay | Admitting: Internal Medicine

## 2024-10-16 DIAGNOSIS — F900 Attention-deficit hyperactivity disorder, predominantly inattentive type: Secondary | ICD-10-CM

## 2024-11-19 ENCOUNTER — Encounter: Payer: Self-pay | Admitting: Internal Medicine

## 2024-11-20 ENCOUNTER — Encounter: Payer: Self-pay | Admitting: Internal Medicine

## 2024-11-21 NOTE — Telephone Encounter (Signed)
 She is overdue for an office visit.

## 2024-11-23 ENCOUNTER — Other Ambulatory Visit: Payer: Self-pay

## 2024-11-23 DIAGNOSIS — F411 Generalized anxiety disorder: Secondary | ICD-10-CM

## 2024-11-23 MED ORDER — VILAZODONE HCL 20 MG PO TABS: 1.0000 | ORAL_TABLET | Freq: Every day | ORAL | 0 refills | Status: DC

## 2024-12-24 ENCOUNTER — Ambulatory Visit: Admitting: Internal Medicine

## 2024-12-24 ENCOUNTER — Encounter: Payer: Self-pay | Admitting: Internal Medicine

## 2024-12-24 VITALS — BP 118/78 | HR 76 | Temp 98.1°F | Resp 16 | Ht 65.5 in | Wt 138.8 lb

## 2024-12-24 DIAGNOSIS — K589 Irritable bowel syndrome without diarrhea: Secondary | ICD-10-CM

## 2024-12-24 DIAGNOSIS — F900 Attention-deficit hyperactivity disorder, predominantly inattentive type: Secondary | ICD-10-CM | POA: Diagnosis not present

## 2024-12-24 DIAGNOSIS — Z23 Encounter for immunization: Secondary | ICD-10-CM | POA: Diagnosis not present

## 2024-12-24 DIAGNOSIS — F411 Generalized anxiety disorder: Secondary | ICD-10-CM | POA: Diagnosis not present

## 2024-12-24 MED ORDER — VILAZODONE HCL 10 MG PO TABS: 10.0000 mg | ORAL_TABLET | Freq: Every day | ORAL | 1 refills | Status: AC

## 2024-12-24 MED ORDER — AMPHETAMINE-DEXTROAMPHETAMINE 5 MG PO TABS: 1.0000 | ORAL_TABLET | Freq: Every day | ORAL | 0 refills | Status: AC | PRN

## 2024-12-24 MED ORDER — VILAZODONE HCL 20 MG PO TABS: 1.0000 | ORAL_TABLET | Freq: Every day | ORAL | 1 refills | Status: AC

## 2024-12-24 NOTE — Progress Notes (Unsigned)
 "  Subjective:  Patient ID: Erin Mays, female    DOB: Oct 28, 1981  Age: 44 y.o. MRN: [id number]  CC: GAD and ADHD   HPI Erin Mays presents for f/up ---  Discussed the use of AI scribe software for clinical note transcription with the patient, who gave verbal consent to proceed.  History of Present Illness Erin Mays is a 44 year old female who presents for a six-month checkup.  She has been under psychiatric care and was initially prescribed Viibryd  at 40 mg, which caused significant gastrointestinal issues such as gas. She reduced the dose to 30 mg, which alleviated the GI symptoms, though she still experiences mild gas. No abdominal pain, diarrhea, or cramping. Her mood is stable on Viibryd  and she finds it more effective than Zoloft .  She is on semaglutide  and has adopted a vegan diet since November, contributing to weight loss. She hopes the vegan diet will also help lower her cholesterol, which is usually around 230 mg/dL.  She experiences occasional dizziness or lightheadedness, with one episode occurring after Christmas, which she attributes to a possible migraine or something she ate, as it resolved after vomiting.  Her last menstrual cycle was from December 16th to 24th, and she reports regular cycles with no chance of pregnancy.  She is trying to increase her physical activity by going to the gym at work and doing kettlebell exercises, although her activity level tends to drop in the fall due to shorter daylight hours.  She received a flu shot at work before Christmas and is willing to receive a tetanus shot today. She takes Adderall, usually half a pill in the morning, and reports no side effects such as heart racing or insomnia, although she has had insomnia for a long time.     Outpatient Medications Prior to Visit  Medication Sig Dispense Refill   ALPRAZolam  (XANAX ) 0.5 MG tablet TAKE ONE TABLET BY MOUTH TWICE DAILY AS NEEDED FOR ANXIETY 180 tablet  0   b complex vitamins capsule Take 1 capsule by mouth daily.     Cholecalciferol (VITAMIN D -3 PO) Take by mouth.     fluticasone  (FLONASE ) 50 MCG/ACT nasal spray Place 1 spray into both nostrils daily. Begin by using 2 sprays in each nare daily for 3 to 5 days, then decrease to 1 spray in each nare daily. 15.8 mL 2   Insulin  Pen Needle 32G X 6 MM MISC 1 Act by Does not apply route once a week. 30 each 1   loratadine  (CLARITIN ) 10 MG tablet Take 1 tablet (10 mg total) by mouth daily. 90 tablet 1   Multiple Vitamin (MULTIVITAMIN ADULT PO) Take by mouth.     amphetamine -dextroamphetamine  (ADDERALL) 5 MG tablet Take 1 tablet (5 mg total) by mouth daily as needed. 30 tablet 0   Vilazodone  HCl 20 MG TABS Take 1 tablet (20 mg total) by mouth daily. (Patient taking differently: Take 1 tablet by mouth daily. 30mg  daily.) 90 tablet 0   Norgestim-Eth Estrad Triphasic (NORGESTIMATE-ETHINYL ESTRADIOL  TRIPHASIC) 0.18/0.215/0.25 MG-25 MCG tab Take 1 tablet by mouth.     Probiotic Product (ALIGN EXTRA STRENGTH) CAPS Take 1 capsule by mouth daily. 90 capsule 1   No facility-administered medications prior to visit.    ROS Review of Systems  Constitutional: Negative.  Negative for appetite change, chills, diaphoresis, fatigue and unexpected weight change.  HENT: Negative.  Negative for trouble swallowing.   Eyes: Negative.   Respiratory: Negative.  Negative for cough, chest tightness, shortness of  breath and wheezing.   Cardiovascular:  Negative for chest pain, palpitations and leg swelling.  Gastrointestinal: Negative.  Negative for abdominal pain, constipation, diarrhea, nausea and vomiting.  Genitourinary: Negative.  Negative for difficulty urinating.  Musculoskeletal: Negative.  Negative for arthralgias and myalgias.  Skin: Negative.   Neurological: Negative.  Negative for dizziness and weakness.  Hematological:  Negative for adenopathy. Does not bruise/bleed easily.  Psychiatric/Behavioral:  Positive  for decreased concentration and sleep disturbance. Negative for agitation, behavioral problems, confusion, dysphoric mood, hallucinations, self-injury and suicidal ideas. The patient is nervous/anxious. The patient is not hyperactive.     Objective:  BP 118/78 (BP Location: Left Arm, Patient Position: Sitting, Cuff Size: Normal)   Pulse 76   Temp 98.1 F (36.7 C) (Oral)   Resp 16   Ht 5' 5.5 (1.664 m)   Wt 138 lb 12.8 oz (63 kg)   LMP 12/03/2024   SpO2 98%   BMI 22.75 kg/m   BP Readings from Last 3 Encounters:  12/24/24 118/78  07/16/24 120/66  12/03/23 108/68    Wt Readings from Last 3 Encounters:  12/24/24 138 lb 12.8 oz (63 kg)  07/16/24 148 lb 12.8 oz (67.5 kg)  12/03/23 145 lb (65.8 kg)    Physical Exam Vitals reviewed.  Constitutional:      Appearance: Normal appearance.  HENT:     Mouth/Throat:     Mouth: Mucous membranes are moist.  Eyes:     General: No scleral icterus.    Conjunctiva/sclera: Conjunctivae normal.  Cardiovascular:     Rate and Rhythm: Normal rate and regular rhythm.     Heart sounds: No murmur heard.    No friction rub. No gallop.  Pulmonary:     Effort: Pulmonary effort is normal.     Breath sounds: No stridor. No wheezing, rhonchi or rales.  Abdominal:     General: Abdomen is scaphoid. Bowel sounds are normal. There is no distension.     Palpations: Abdomen is soft. There is no hepatomegaly, splenomegaly or mass.     Tenderness: There is no abdominal tenderness. There is no guarding or rebound.  Musculoskeletal:        General: Normal range of motion.     Cervical back: Neck supple.     Right lower leg: No edema.     Left lower leg: No edema.  Lymphadenopathy:     Cervical: No cervical adenopathy.  Skin:    General: Skin is warm.  Neurological:     General: No focal deficit present.     Mental Status: She is alert.  Psychiatric:        Mood and Affect: Mood normal.        Behavior: Behavior normal.     Lab Results   Component Value Date   WBC 6.9 03/24/2024   HGB 14.3 03/24/2024   HCT 43 03/24/2024   PLT 365 03/24/2024   GLUCOSE 88 02/04/2023   CHOL 260 (A) 03/24/2024   TRIG 87 03/24/2024   HDL 98 (A) 03/24/2024   LDLDIRECT 147.9 10/29/2013   LDLCALC 143 03/24/2024   ALT 10 03/24/2024   AST 15 03/24/2024   NA 137 03/24/2024   K 4.5 03/24/2024   CL 102 03/24/2024   CREATININE 0.8 03/24/2024   BUN 13 03/24/2024   CO2 27 (A) 03/24/2024   TSH 1.67 03/24/2024   HGBA1C 5.3 03/24/2024    No results found.  Assessment & Plan:  Irritable bowel syndrome, unspecified type  GAD (  generalized anxiety disorder) -     Vilazodone  HCl; Take 1 tablet (10 mg total) by mouth daily.  Dispense: 90 tablet; Refill: 1 -     Vilazodone  HCl; Take 1 tablet (20 mg total) by mouth daily.  Dispense: 90 tablet; Refill: 1  Immunization due -     Tdap vaccine greater than or equal to 7yo IM  ADHD (attention deficit hyperactivity disorder), inattentive type -     Amphetamine -Dextroamphetamine ; Take 1 tablet (5 mg total) by mouth daily as needed.  Dispense: 30 tablet; Refill: 0     Follow-up: Return in about 6 months (around 06/23/2025).  Debby Molt, MD "

## 2024-12-24 NOTE — Patient Instructions (Signed)
Irritable Bowel Syndrome, Adult  Irritable bowel syndrome (IBS) is a group of symptoms that affects the organs responsible for digestion (gastrointestinal tract, or GI tract). IBS is not one specific disease. To regulate how the GI tract works, the body sends signals back and forth between the intestines and the brain. If you have IBS, there may be a problem with these signals. As a result, the GI tract does not function normally. The intestines may become more sensitive and overreact to certain things. This may be especially true when you eat certain foods or when you are under stress. There are four main types of IBS. These may be determined based on the consistency of your stool (feces): IBS with mostly (predominance of) diarrhea. IBS with predominance of constipation. IBS with mixed bowel habits. This includes both diarrhea and constipation. IBS unclassified. This includes IBS that cannot be categorized into one of the other three main types. It is important to know which type of IBS you have. Certain treatments are more likely to be helpful for certain types of IBS. What are the causes? The exact cause of IBS is not known. What increases the risk? You may have a higher risk for IBS if you: Are female. Are younger than 40 years. Have a family history of IBS. Have a mental health condition, such as depression, anxiety, or post-traumatic stress disorder. Have had a bacterial infection of your GI tract. What are the signs or symptoms? Symptoms of IBS vary from person to person. The main symptom is abdominal pain or discomfort. Other symptoms usually include one or more of the following: Diarrhea, constipation, or both. Swelling or bloating in the abdomen. Feeling full after eating a small or regular-sized meal. Frequent gas. Mucus in the stool. A feeling of having more stool left after a bowel movement. Symptoms tend to come and go. They may be triggered by stress, mental health  conditions, or certain foods. How is this diagnosed? This condition may be diagnosed based on a physical exam, your medical history, and your symptoms. You may have tests, such as: Blood tests. Stool test. Colonoscopy. This is a procedure in which your GI tract is viewed with a long, thin, flexible tube. How is this treated? There is no cure for IBS, but treatment can help relieve symptoms. Treatment depends on the type of IBS you have, and may include: Changes to your diet, such as: Avoiding foods that cause symptoms. Drinking more water. Following a low-FODMAP (fermentable oligosaccharides, disaccharides, monosaccharides, and polyols) diet for up to 6 weeks, or as told by your health care provider. FODMAPs are sugars that are hard for some people to digest. Eating more fiber. Eating small meals at the same times every day. Medicines. These may include: Fiber supplements, if you have constipation. Medicine to control diarrhea (antidiarrheal medicines). Medicine to help control muscle tightening (spasms) in your GI tract (antispasmodic medicines). Medicines to help with mental health conditions, such as antidepressants. Talk therapy or counseling. Working with a dietitian to help create a food plan that is right for you. Managing your stress. Follow these instructions at home: Eating and drinking  Eat a healthy diet. Eat 5-6 small meals a day. Try to eat meals at about the same times each day. Do not eat large meals. Gradually eat more fiber-rich foods. These include whole grains, fruits, and vegetables. This may be especially helpful if you have IBS with constipation. Eat a diet low in FODMAPs. You may need to avoid foods such as   citrus fruits, cabbage, garlic, and onions. Drink enough fluid to keep your urine pale yellow. Keep a journal of foods that seem to trigger symptoms. Avoid foods and drinks that: Contain added sugar. Make your symptoms worse. These may include dairy  products, caffeinated drinks, and carbonated drinks. Alcohol use Do not drink alcohol if: Your health care provider tells you not to drink. You are pregnant, may be pregnant, or are planning to become pregnant. If you drink alcohol: Limit how much you have to: 0-1 drink a day for women. 0-2 drinks a day for men. Know how much alcohol is in your drink. In the [place] one drink equals one 12 oz bottle of beer (355 mL), one 5 oz glass of wine (148 mL), or one 1 oz glass of hard liquor (44 mL) General instructions Take over-the-counter and prescription medicines only as told by your health care provider. This includes supplements. Get enough exercise. Do at least 150 minutes of moderate-intensity exercise each week. Manage your stress. Getting enough sleep and exercise can help you manage stress. Keep all follow-up visits. This is important. This includes all visits with your health care provider and therapist. Where to find more information International Foundation for Functional Gastrointestinal Disorders: aboutibs.org National Institute of Diabetes and Digestive and Kidney Diseases: niddk.nih.gov Contact a health care provider if: You have constant pain. You lose weight. You have diarrhea that gets worse. You have bleeding from the rectum. You vomit often. You have a fever. Get help right away if: You have severe abdominal pain. You have diarrhea with symptoms of dehydration, such as dizziness or dry mouth. You have bloody or black stools. You have severe abdominal bloating. You have vomiting that does not stop. You have blood in your vomit. Summary Irritable bowel syndrome (IBS) is not one specific disease. It is a group of symptoms that affects digestion. Your intestines may become more sensitive and overreact to certain things. This may be especially true when you eat certain foods or when you are under stress. There is no cure for IBS, but treatment can help relieve  symptoms. This information is not intended to replace advice given to you by your health care provider. Make sure you discuss any questions you have with your health care provider. Document Revised: 11/15/2021 Document Reviewed: 11/15/2021 Elsevier Patient Education  2024 [employer]
